# Patient Record
Sex: Male | Born: 1945 | Race: Black or African American | Hispanic: No | Marital: Married | Smoking: Former smoker
Health system: Southern US, Community
[De-identification: ages and names within clinical notes are randomized; demographics above are authoritative.]

## PROBLEM LIST (undated history)

## (undated) DIAGNOSIS — I639 Cerebral infarction, unspecified: Secondary | ICD-10-CM

## (undated) DIAGNOSIS — F32A Depression, unspecified: Secondary | ICD-10-CM

## (undated) DIAGNOSIS — E119 Type 2 diabetes mellitus without complications: Secondary | ICD-10-CM

## (undated) DIAGNOSIS — F329 Major depressive disorder, single episode, unspecified: Secondary | ICD-10-CM

## (undated) DIAGNOSIS — I10 Essential (primary) hypertension: Secondary | ICD-10-CM

## (undated) HISTORY — DX: Cerebral infarction, unspecified: I63.9

## (undated) HISTORY — PX: KNEE SURGERY: SHX244

---

## 1998-05-04 ENCOUNTER — Encounter: Payer: Self-pay | Admitting: Surgery

## 1998-05-04 ENCOUNTER — Inpatient Hospital Stay (HOSPITAL_COMMUNITY): Admission: RE | Admit: 1998-05-04 | Discharge: 1998-05-11 | Payer: Self-pay | Admitting: Surgery

## 1998-05-11 ENCOUNTER — Encounter: Payer: Self-pay | Admitting: Urology

## 2007-05-07 ENCOUNTER — Emergency Department (HOSPITAL_COMMUNITY): Admission: EM | Admit: 2007-05-07 | Discharge: 2007-05-07 | Payer: Self-pay | Admitting: Emergency Medicine

## 2007-05-12 ENCOUNTER — Ambulatory Visit (HOSPITAL_COMMUNITY): Admission: RE | Admit: 2007-05-12 | Discharge: 2007-05-12 | Payer: Self-pay | Admitting: Sports Medicine

## 2007-05-17 ENCOUNTER — Inpatient Hospital Stay (HOSPITAL_COMMUNITY): Admission: AD | Admit: 2007-05-17 | Discharge: 2007-05-18 | Payer: Self-pay | Admitting: Orthopedic Surgery

## 2007-08-09 ENCOUNTER — Encounter: Admission: RE | Admit: 2007-08-09 | Discharge: 2007-08-09 | Payer: Self-pay | Admitting: Orthopedic Surgery

## 2009-07-13 IMAGING — US US EXTREM LOW VENOUS*L*
1 series · 14 of 24 positions shown · non-contrast
Comparison: None

CLINICAL DATA: Left leg swelling



[Series 1: us extrem low venous*left* · 14 of 26 slices shown]
[im 1/26]
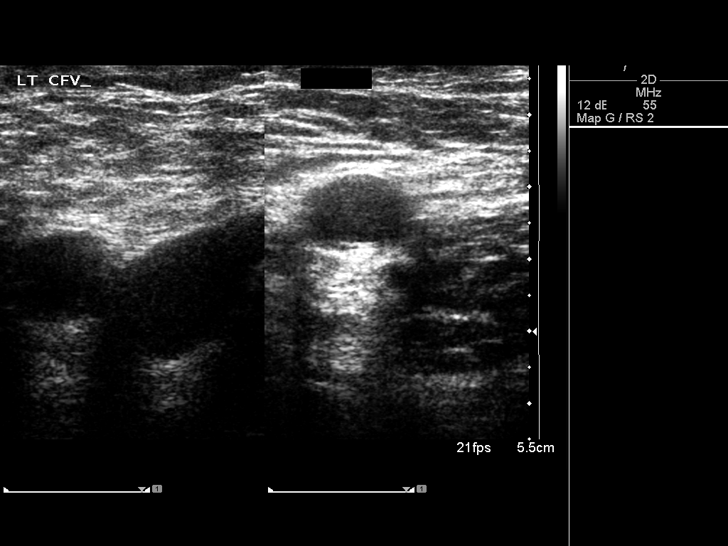
[im 3/26]
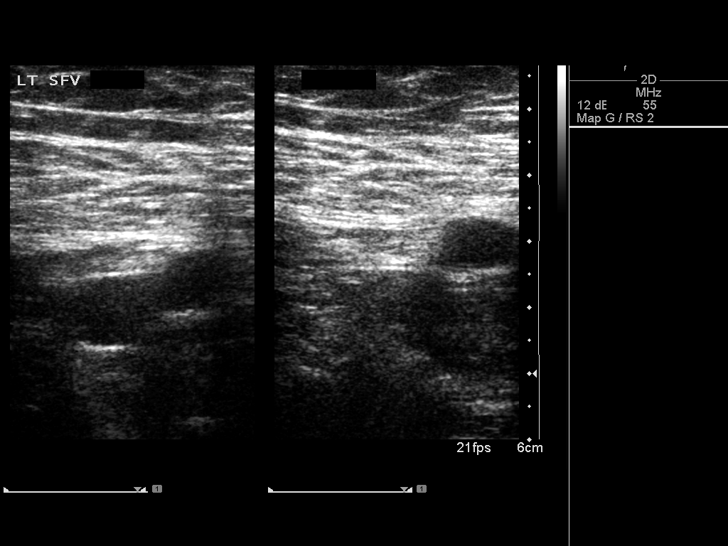
[im 5/26]
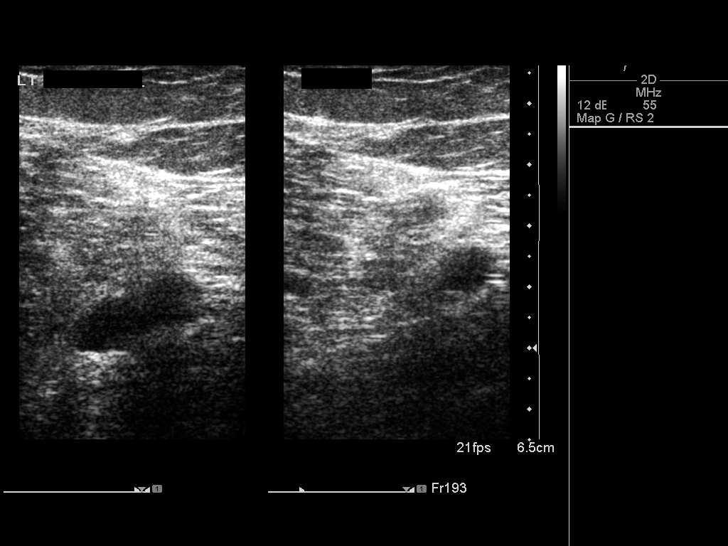
[im 7/26]
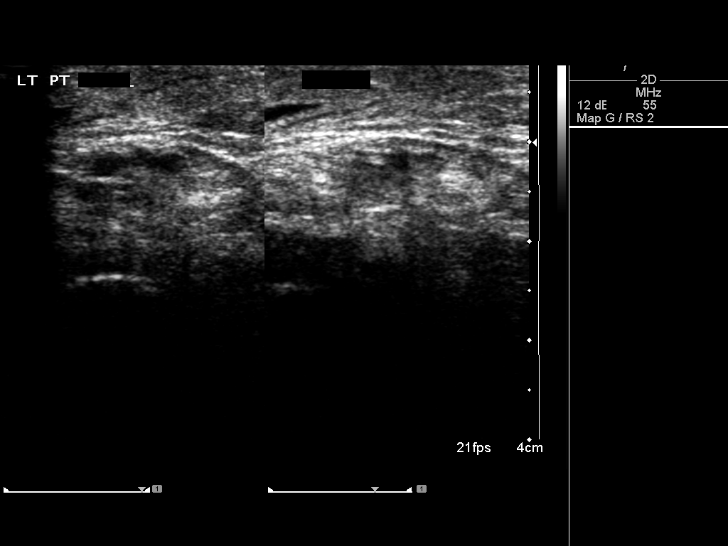
[im 8/26]
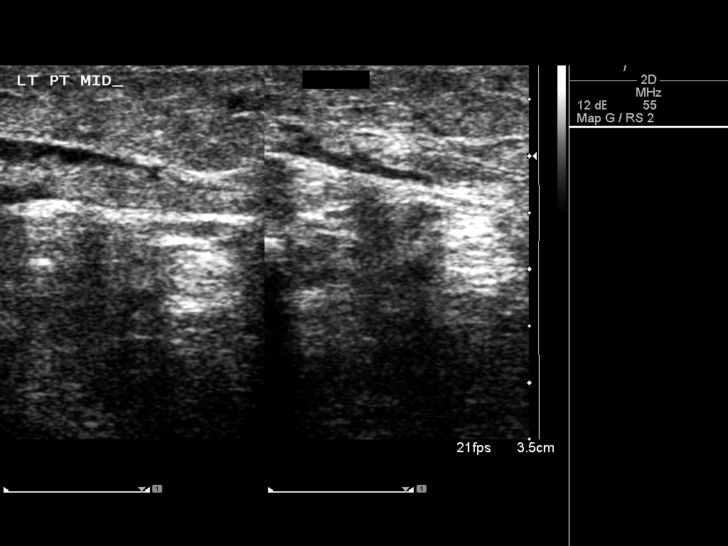
[im 10/26]
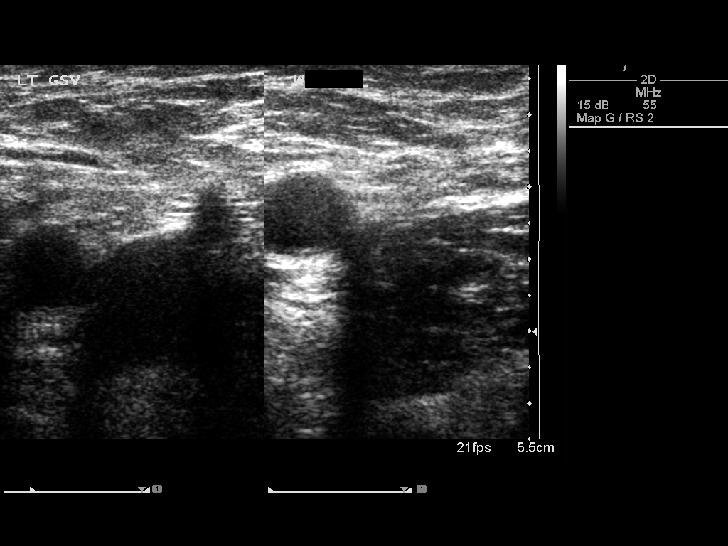
[im 12/26]
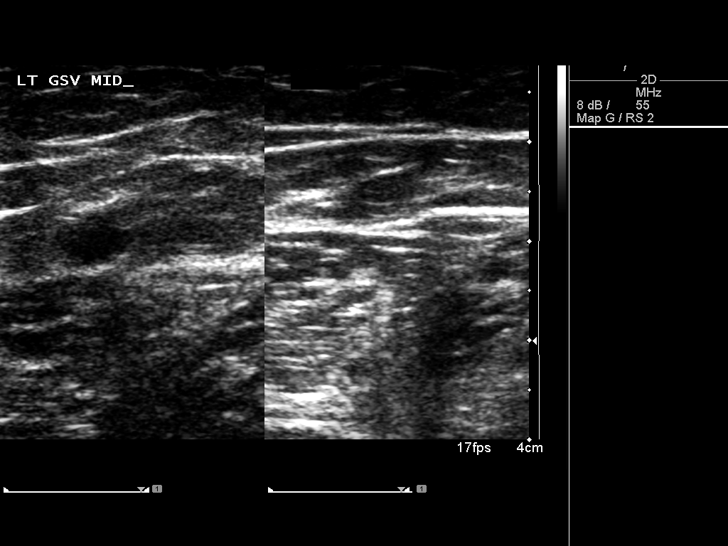
[im 14/26]
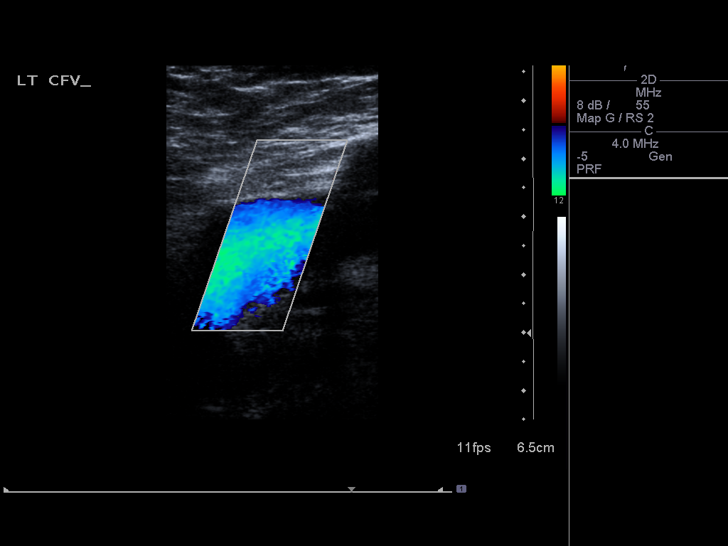
[im 16/26]
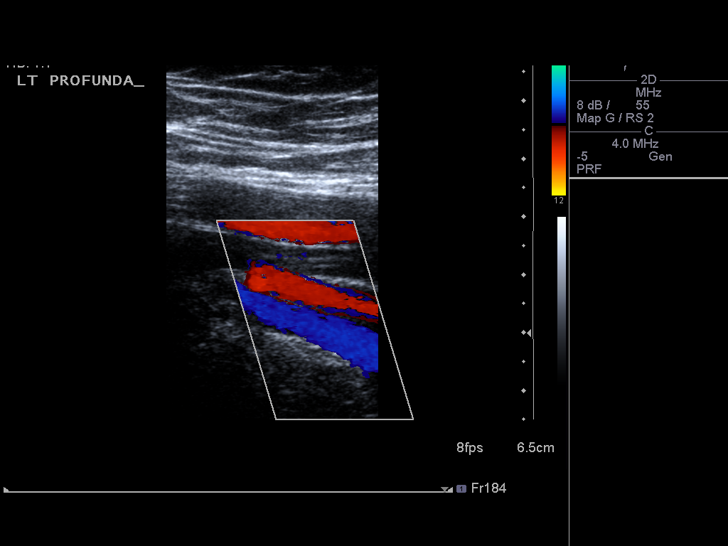
[im 18/26]
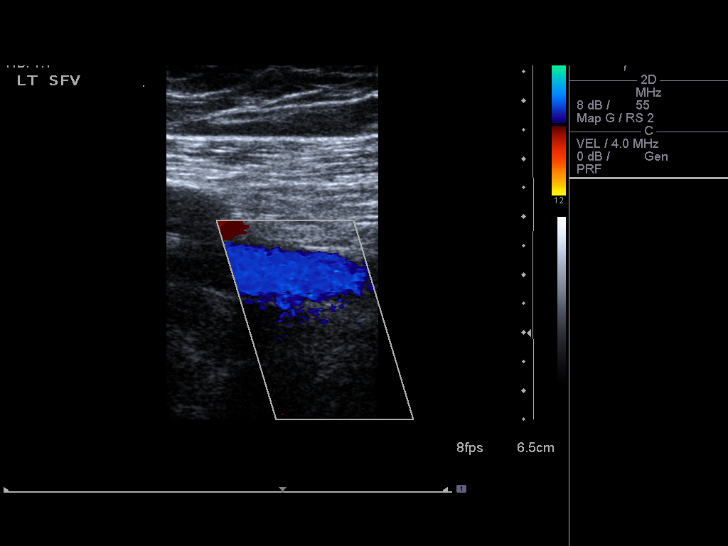
[im 20/26]
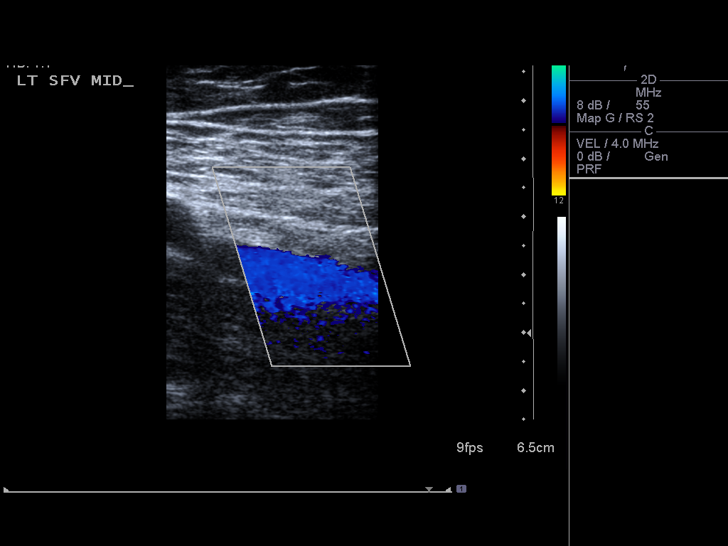
[im 21/26]
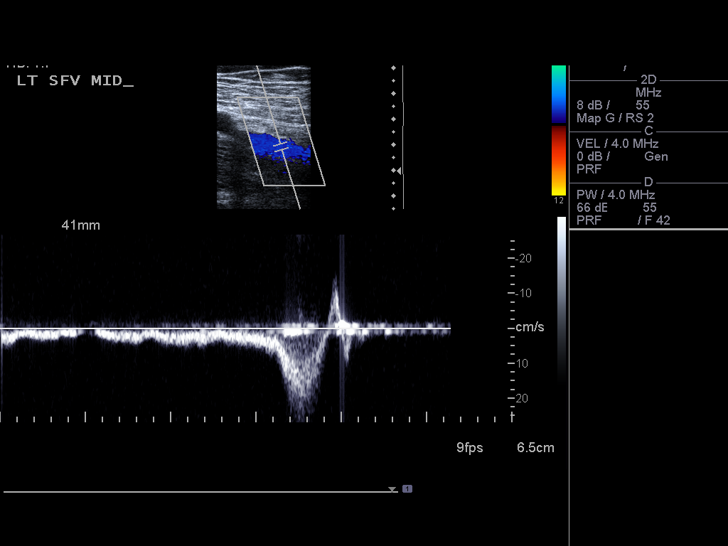
[im 23/26]
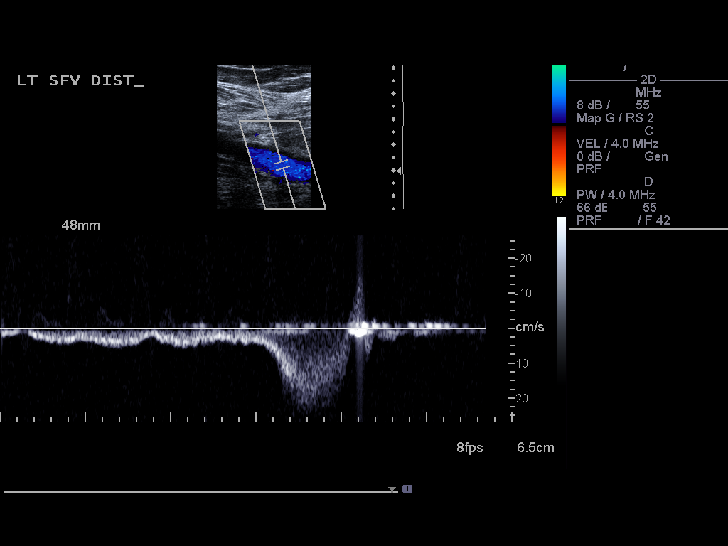
[im 26/26]
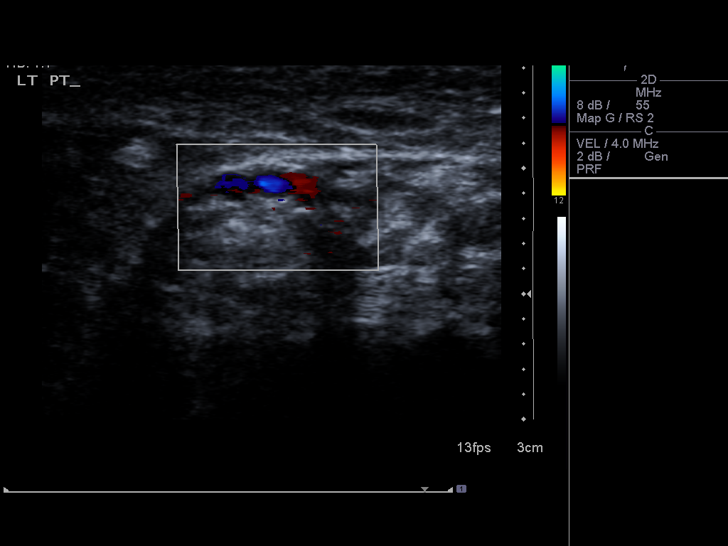

[14 of 24 positions shown; findings below may reference images not displayed]

FINDINGS: The major deep veins from the groin to the popliteal
space show normal compressibility, phasicity, and augmentation.  No
filling defects identified.
IMPRESSION: No evidence for left lower extremity deep venous thrombosis.

## 2010-05-02 ENCOUNTER — Encounter: Payer: Self-pay | Admitting: Sports Medicine

## 2010-08-24 NOTE — Op Note (Signed)
Kevin Flynn, Kevin Flynn               ACCOUNT NO.:  [id number]   MEDICAL RECORD NO.:  [id number]          PATIENT TYPE:  INP   LOCATION:  1510                         FACILITY:  [facility]   PHYSICIAN:  Madlyn Frankel. Charlann Boxer, M.D.  DATE OF BIRTH:  20-Dec-1945   DATE OF PROCEDURE:  05/16/2007  DATE OF DISCHARGE:  05/12/2007                               OPERATIVE REPORT   PREOPERATIVE DIAGNOSIS:  Left quadriceps tendon rupture.   POSTOPERATIVE DIAGNOSIS:  Left quadriceps tendon rupture.   PROCEDURE:  Open repair of left quadriceps tendon.   SURGEON:  Madlyn Frankel. Charlann Boxer, M.D.   ASSISTANT:  Yetta Glassman. Mann, P.A.-C.   ANESTHESIA:  General.   ESTIMATED BLOOD LOSS:  Minimal.   TOURNIQUET TIME:  49 minutes at 300 mmHg.   DRAINS:  None.   COMPLICATIONS:  None.   INDICATIONS FOR PROCEDURE:  Kevin Flynn is a 65 year old gentleman who  fell injuring his left leg and thigh on January 26.  He was initially  seen and evaluated by Dr. Mertha Finders who diagnosed him clinically with  quad rupture based on inability to perform straight leg raising and  palpable defect.  He is sent for evaluation.  When I saw him, an MRI had  already been ordered.  He had reported to me no previous problems with  the knee.  MRI had indicated meniscal pathology, however, the primary  pathology was quad tendon.  I reviewed with him the risks and benefits  of nonoperative versus operative treatment, the risks of infection,  failure of the repair, the therapy that is required after this type of  surgery.  Consent was obtained.   PROCEDURE IN DETAIL:  The patient was brought to the operating theater.  Once adequate anesthesia and preoperative antibiotics, Ancef, were  administered, the patient was positioned supine on the operating table.  A proximal thigh tourniquet was placed.  The left lower extremity was  pre-scrubbed, prepped and draped in a sterile fashion.  The leg was  exsanguinated and tourniquet elevated.  A midline  incision was made.  The midline incision revealed obvious seromatous material that drained  from the knee right from the onset.  I created flaps of soft tissue down  to the layer of the extensor mechanism, both around the knee and the  quadriceps tendon.  It was obvious and evident at the time that the  patient had a very significant injury to his quadriceps mechanism with  tears into both the vastus medialis and the vastus lateralis muscle  bellies, themselves.  Following the initial debridement, it was also  evident that the tendon tore approximately 1 cm from its insertion onto  the patella.  Given the potential healing of tendons and bone, I opened  up the remaining tendon against the patella, saving this for later  repair over the top of the primary repair.   Following this debridement and preparation, I passed two #2 FiberWire  sutures up through the quadriceps tendon in a [place] weave pattern.  With this, I was left with a medial strand of two in the central portion  and  then laterally.  Following placement of these strands, I used a  drill and created three drill holes parallel in the patella from the  proximal and distal aspects of the patella.  The ends of these drills  were found and the sutures were passed through the central portion.  At  this point, the towel clip was utilized to stabilize the patella as I  drew the quadriceps mechanism down to the patella.  Holding with  traction, I sutured the two lateral sutures first followed by the two  medial.  I then sewed these four strands together.  Following this,  there was an excellent reapproximation of the bulk of the quad tendon to  the patellar bony surface.   Time was now spent repairing this muscle.  I used #1 Vicryl to  reapproximate the underlying fascia of the vastus medialis back down to  the central belly of the tendon.  I did this on the lateral side.  Both  muscle tears were quite significant. Following this, I  reapproximated  the tendinous portion right off the base of the patella that I had  opened to allow for primary repair.  I reapproximated this over the top  of the primary repair.  At this time, I irrigated the knee wound.  I  reapproximated the subcu layer with 2-0 Vicryl and used a 4-0 running  Monocryl in the skin.  The skin was cleaned, dressed, and dried  sterilely with Steri-Strips and a bulky dressing.  The knee was placed  in a knee immobilizer with an ice pack.  He was brought to the recovery  room in stable condition extubated.      Madlyn Frankel Charlann Boxer, M.D.  Electronically Signed     MDO/MEDQ  D:  05/16/2007  T:  05/17/2007  Job:  [id number]

## 2010-12-31 LAB — CBC
MCHC: 34.5
MCV: 90.3
Platelets: 254
RBC: 4.94
RDW: 14.8

## 2010-12-31 LAB — BASIC METABOLIC PANEL
BUN: 10
CO2: 31
Calcium: 9.2
Creatinine, Ser: 1.05
GFR calc Af Amer: >60
Glucose, Bld: 110 — ABNORMAL HIGH

## 2010-12-31 LAB — RAPID URINE DRUG SCREEN, HOSP PERFORMED
Benzodiazepines: NOT DETECTED
Cocaine: NOT DETECTED
Opiates: POSITIVE — AB

## 2010-12-31 LAB — DIFFERENTIAL
Basophils Absolute: 0
Basophils Relative: 1
Eosinophils Absolute: 0.1
Neutro Abs: 1.7
Neutrophils Relative %: 33 — ABNORMAL LOW

## 2010-12-31 LAB — PROTIME-INR
INR: 1.1
Prothrombin Time: 13.9

## 2010-12-31 LAB — URINALYSIS, ROUTINE W REFLEX MICROSCOPIC
Glucose, UA: NEGATIVE
Ketones, ur: NEGATIVE
Specific Gravity, Urine: 1.019
pH: 5.5

## 2010-12-31 LAB — APTT: aPTT: 30

## 2013-03-02 ENCOUNTER — Encounter (HOSPITAL_COMMUNITY): Payer: Self-pay | Admitting: Emergency Medicine

## 2013-03-02 ENCOUNTER — Emergency Department (HOSPITAL_COMMUNITY): Payer: Medicare Other

## 2013-03-02 ENCOUNTER — Emergency Department (HOSPITAL_COMMUNITY)
Admission: EM | Admit: 2013-03-02 | Discharge: 2013-03-02 | Disposition: A | Discharge status: Home / Self Care | Payer: Medicare Other | Attending: Emergency Medicine | Admitting: Emergency Medicine

## 2013-03-02 DIAGNOSIS — S43401A Unspecified sprain of right shoulder joint, initial encounter: Secondary | ICD-10-CM

## 2013-03-02 DIAGNOSIS — Z87891 Personal history of nicotine dependence: Secondary | ICD-10-CM | POA: Insufficient documentation

## 2013-03-02 DIAGNOSIS — Y9389 Activity, other specified: Secondary | ICD-10-CM | POA: Insufficient documentation

## 2013-03-02 DIAGNOSIS — IMO0002 Reserved for concepts with insufficient information to code with codable children: Secondary | ICD-10-CM | POA: Insufficient documentation

## 2013-03-02 DIAGNOSIS — Z8659 Personal history of other mental and behavioral disorders: Secondary | ICD-10-CM | POA: Insufficient documentation

## 2013-03-02 DIAGNOSIS — E119 Type 2 diabetes mellitus without complications: Secondary | ICD-10-CM | POA: Insufficient documentation

## 2013-03-02 DIAGNOSIS — S335XXA Sprain of ligaments of lumbar spine, initial encounter: Secondary | ICD-10-CM | POA: Insufficient documentation

## 2013-03-02 DIAGNOSIS — Y92410 Unspecified street and highway as the place of occurrence of the external cause: Secondary | ICD-10-CM | POA: Insufficient documentation

## 2013-03-02 HISTORY — DX: Depression, unspecified: F32.A

## 2013-03-02 HISTORY — DX: Type 2 diabetes mellitus without complications: E11.9

## 2013-03-02 HISTORY — DX: Major depressive disorder, single episode, unspecified: F32.9

## 2013-03-02 MED ORDER — IBUPROFEN 600 MG PO TABS: 600.0000 mg | ORAL_TABLET | Freq: Four times a day (QID) | ORAL | Status: DC | PRN

## 2013-03-02 MED ORDER — HYDROCODONE-ACETAMINOPHEN 5-325 MG PO TABS: 1.0000 | ORAL_TABLET | Freq: Four times a day (QID) | ORAL | Status: DC | PRN

## 2013-03-02 MED ORDER — HYDROCODONE-ACETAMINOPHEN 5-325 MG PO TABS
1.0000 | ORAL_TABLET | Freq: Once | ORAL | Status: AC
  Administered 2013-03-02: 1 via ORAL
  Filled 2013-03-02: qty 1

## 2013-03-02 MED ORDER — CYCLOBENZAPRINE HCL 10 MG PO TABS: 10.0000 mg | ORAL_TABLET | Freq: Two times a day (BID) | ORAL | Status: DC | PRN
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
## 2013-03-02 NOTE — ED Notes (Signed)
Pt escorted to treatment room 7, from the waiting area

## 2013-03-06 NOTE — ED Provider Notes (Signed)
Medical screening examination/treatment/procedure(s) were performed by non-physician practitioner and as supervising physician I was immediately available for consultation/collaboration.  EKG Interpretation   None         Mariavictoria Nottingham J Maebry Obrien, MD 03/06/13 0726 
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
## 2019-02-28 ENCOUNTER — Other Ambulatory Visit: Payer: Self-pay

## 2019-02-28 DIAGNOSIS — Z20822 Contact with and (suspected) exposure to covid-19: Secondary | ICD-10-CM

## 2019-03-04 LAB — NOVEL CORONAVIRUS, NAA: SARS-CoV-2, NAA: NOT DETECTED

## 2022-07-08 ENCOUNTER — Other Ambulatory Visit (HOSPITAL_COMMUNITY): Payer: Self-pay | Admitting: Neurology

## 2022-07-08 DIAGNOSIS — R251 Tremor, unspecified: Secondary | ICD-10-CM

## 2022-07-29 ENCOUNTER — Encounter (HOSPITAL_COMMUNITY)
Admission: RE | Admit: 2022-07-29 | Discharge: 2022-07-29 | Disposition: A | Discharge status: Home / Self Care | Payer: No Typology Code available for payment source | Source: Ambulatory Visit | Attending: Neurology | Admitting: Neurology

## 2022-07-29 DIAGNOSIS — R251 Tremor, unspecified: Secondary | ICD-10-CM | POA: Insufficient documentation

## 2022-07-29 MED ORDER — FAMOTIDINE 20 MG PO TABS
40.0000 mg | ORAL_TABLET | Freq: Once | ORAL | Status: AC
  Administered 2022-07-29: 40 mg via ORAL
  Filled 2022-07-29: qty 2

## 2022-07-29 MED ORDER — POTASSIUM IODIDE (ANTIDOTE) 130 MG PO TABS
ORAL_TABLET | ORAL | Status: AC
  Filled 2022-07-29: qty 1

## 2022-07-29 MED ORDER — DIPHENHYDRAMINE HCL 50 MG/ML IJ SOLN
50.0000 mg | Freq: Once | INTRAMUSCULAR | Status: AC
  Administered 2022-07-29: 50 mg via INTRAVENOUS

## 2022-07-29 MED ORDER — IOFLUPANE I 123 185 MBQ/2.5ML IV SOLN
4.7000 | Freq: Once | INTRAVENOUS | Status: AC | PRN
  Administered 2022-07-29: 4.7 via INTRAVENOUS
  Filled 2022-07-29: qty 5

## 2022-07-29 MED ORDER — DIPHENHYDRAMINE HCL 50 MG/ML IJ SOLN
INTRAMUSCULAR | Status: AC
  Filled 2022-07-29: qty 1

## 2022-07-29 MED ORDER — POTASSIUM IODIDE (ANTIDOTE) 130 MG PO TABS
130.0000 mg | ORAL_TABLET | Freq: Once | ORAL | Status: AC
  Administered 2022-07-29: 130 mg via ORAL

## 2022-07-29 NOTE — Progress Notes (Signed)
PA assessed a patient in Nuc Med after taking oral medicaton. Started to have a rash left upper arm pain denies itching, pain, or shortness of breath. Airway intact. Resting comfortably on stretcher wife at bedside.

## 2022-07-29 NOTE — Progress Notes (Signed)
Patient is scheduled for a NM scan in Radiology today   Patient had a reaction of hives with urticaria to left antecubital after taking 100 mg of potassium iodine po.  Hives appear to be contained with no radiation to chest or back. Patient denies any shob or sore throat. Able to speak in full sentences. Per wife at bedside states that there is no change in the quality of his voice.   Patient was taken to IR holding room for observation. An IV was started and the Patient was given Benadryl 50 mg IV and Pecid 40 mg. Patient placed on monitor. Wife at bedside. Patient was observed for 1 hour after such time the hives appear to be resolving with no additional symptoms reporting. Patient and wife were discharged from IR to continue to NM for study. Patient and wife instructed to call 911 should his symptoms worsen or any issues with breathing or airway. Patient and wife verbalized understanding and are in agreement with the plan of care.   IR MD Dr. DD Deanne Coffer made aware and is in agreement with plan of care.

## 2023-04-20 ENCOUNTER — Inpatient Hospital Stay (HOSPITAL_COMMUNITY): Payer: No Typology Code available for payment source

## 2023-04-20 ENCOUNTER — Encounter (HOSPITAL_COMMUNITY): Payer: Self-pay

## 2023-04-20 ENCOUNTER — Emergency Department (HOSPITAL_COMMUNITY): Payer: No Typology Code available for payment source

## 2023-04-20 ENCOUNTER — Inpatient Hospital Stay (HOSPITAL_COMMUNITY)
Admission: EM | Admit: 2023-04-20 | Discharge: 2023-04-22 | DRG: 065 | Disposition: A | Discharge status: Home / Self Care | Payer: No Typology Code available for payment source | Attending: Internal Medicine | Admitting: Internal Medicine

## 2023-04-20 ENCOUNTER — Other Ambulatory Visit: Payer: Self-pay

## 2023-04-20 DIAGNOSIS — I429 Cardiomyopathy, unspecified: Secondary | ICD-10-CM | POA: Diagnosis present

## 2023-04-20 DIAGNOSIS — I110 Hypertensive heart disease with heart failure: Secondary | ICD-10-CM | POA: Diagnosis present

## 2023-04-20 DIAGNOSIS — Z91199 Patient's noncompliance with other medical treatment and regimen due to unspecified reason: Secondary | ICD-10-CM

## 2023-04-20 DIAGNOSIS — J90 Pleural effusion, not elsewhere classified: Secondary | ICD-10-CM | POA: Diagnosis not present

## 2023-04-20 DIAGNOSIS — J9811 Atelectasis: Secondary | ICD-10-CM | POA: Diagnosis present

## 2023-04-20 DIAGNOSIS — Z888 Allergy status to other drugs, medicaments and biological substances status: Secondary | ICD-10-CM

## 2023-04-20 DIAGNOSIS — R471 Dysarthria and anarthria: Secondary | ICD-10-CM | POA: Diagnosis present

## 2023-04-20 DIAGNOSIS — I2510 Atherosclerotic heart disease of native coronary artery without angina pectoris: Secondary | ICD-10-CM | POA: Diagnosis present

## 2023-04-20 DIAGNOSIS — R270 Ataxia, unspecified: Secondary | ICD-10-CM | POA: Diagnosis present

## 2023-04-20 DIAGNOSIS — I5020 Unspecified systolic (congestive) heart failure: Secondary | ICD-10-CM

## 2023-04-20 DIAGNOSIS — Z6833 Body mass index (BMI) 33.0-33.9, adult: Secondary | ICD-10-CM | POA: Diagnosis not present

## 2023-04-20 DIAGNOSIS — Z87891 Personal history of nicotine dependence: Secondary | ICD-10-CM

## 2023-04-20 DIAGNOSIS — I639 Cerebral infarction, unspecified: Principal | ICD-10-CM | POA: Diagnosis present

## 2023-04-20 DIAGNOSIS — I5022 Chronic systolic (congestive) heart failure: Secondary | ICD-10-CM | POA: Diagnosis present

## 2023-04-20 DIAGNOSIS — G20C Parkinsonism, unspecified: Secondary | ICD-10-CM | POA: Diagnosis present

## 2023-04-20 DIAGNOSIS — E782 Mixed hyperlipidemia: Secondary | ICD-10-CM

## 2023-04-20 DIAGNOSIS — E669 Obesity, unspecified: Secondary | ICD-10-CM | POA: Diagnosis present

## 2023-04-20 DIAGNOSIS — G8191 Hemiplegia, unspecified affecting right dominant side: Secondary | ICD-10-CM | POA: Diagnosis present

## 2023-04-20 DIAGNOSIS — I700 Atherosclerosis of aorta: Secondary | ICD-10-CM | POA: Diagnosis present

## 2023-04-20 DIAGNOSIS — E119 Type 2 diabetes mellitus without complications: Secondary | ICD-10-CM

## 2023-04-20 DIAGNOSIS — I6349 Cerebral infarction due to embolism of other cerebral artery: Secondary | ICD-10-CM | POA: Diagnosis not present

## 2023-04-20 DIAGNOSIS — R29701 NIHSS score 1: Secondary | ICD-10-CM | POA: Diagnosis present

## 2023-04-20 DIAGNOSIS — G4733 Obstructive sleep apnea (adult) (pediatric): Secondary | ICD-10-CM | POA: Diagnosis present

## 2023-04-20 DIAGNOSIS — F32A Depression, unspecified: Secondary | ICD-10-CM | POA: Diagnosis present

## 2023-04-20 DIAGNOSIS — F1090 Alcohol use, unspecified, uncomplicated: Secondary | ICD-10-CM | POA: Diagnosis present

## 2023-04-20 DIAGNOSIS — G4752 REM sleep behavior disorder: Secondary | ICD-10-CM | POA: Diagnosis present

## 2023-04-20 DIAGNOSIS — D72819 Decreased white blood cell count, unspecified: Secondary | ICD-10-CM

## 2023-04-20 DIAGNOSIS — I361 Nonrheumatic tricuspid (valve) insufficiency: Secondary | ICD-10-CM | POA: Diagnosis not present

## 2023-04-20 DIAGNOSIS — G4730 Sleep apnea, unspecified: Secondary | ICD-10-CM

## 2023-04-20 DIAGNOSIS — I509 Heart failure, unspecified: Secondary | ICD-10-CM

## 2023-04-20 DIAGNOSIS — M7989 Other specified soft tissue disorders: Secondary | ICD-10-CM | POA: Diagnosis not present

## 2023-04-20 HISTORY — DX: Essential (primary) hypertension: I10

## 2023-04-20 LAB — CBC
HCT: 44.3 % (ref 39.0–52.0)
HCT: 45.3 % (ref 39.0–52.0)
Hemoglobin: 14.7 g/dL (ref 13.0–17.0)
Hemoglobin: 14.8 g/dL (ref 13.0–17.0)
MCH: 30.8 pg (ref 26.0–34.0)
MCH: 29.8 pg (ref 26.0–34.0)
MCHC: 33.2 g/dL (ref 30.0–36.0)
MCHC: 32.7 g/dL (ref 30.0–36.0)
MCV: 92.9 fL (ref 80.0–100.0)
MCV: 91.3 fL (ref 80.0–100.0)
Platelets: 188 10*3/uL (ref 150–400)
Platelets: 183 10*3/uL (ref 150–400)
RBC: 4.77 MIL/uL (ref 4.22–5.81)
RBC: 4.96 MIL/uL (ref 4.22–5.81)
RDW: 15.7 % — ABNORMAL HIGH (ref 11.5–15.5)
RDW: 15.8 % — ABNORMAL HIGH (ref 11.5–15.5)
WBC: 3.9 10*3/uL — ABNORMAL LOW (ref 4.0–10.5)
WBC: 4.4 10*3/uL (ref 4.0–10.5)
nRBC: 0 % (ref 0.0–0.2)
nRBC: 0 % (ref 0.0–0.2)

## 2023-04-20 LAB — URINALYSIS, ROUTINE W REFLEX MICROSCOPIC
Bilirubin Urine: NEGATIVE
Glucose, UA: NEGATIVE mg/dL
Hgb urine dipstick: NEGATIVE
Ketones, ur: NEGATIVE mg/dL
Leukocytes,Ua: NEGATIVE
Nitrite: NEGATIVE
Protein, ur: NEGATIVE mg/dL
Specific Gravity, Urine: 1.023 (ref 1.005–1.030)
pH: 5 (ref 5.0–8.0)

## 2023-04-20 LAB — RESP PANEL BY RT-PCR (RSV, FLU A&B, COVID)  RVPGX2
Influenza A by PCR: NEGATIVE
Influenza B by PCR: NEGATIVE
Resp Syncytial Virus by PCR: NEGATIVE
SARS Coronavirus 2 by RT PCR: NEGATIVE

## 2023-04-20 LAB — HEMOGLOBIN A1C
Hgb A1c MFr Bld: 5.6 % (ref 4.8–5.6)
Mean Plasma Glucose: 114.02 mg/dL

## 2023-04-20 LAB — BASIC METABOLIC PANEL
Anion gap: 7 (ref 5–15)
BUN: 14 mg/dL (ref 8–23)
CO2: 27 mmol/L (ref 22–32)
Calcium: 9 mg/dL (ref 8.9–10.3)
Chloride: 104 mmol/L (ref 98–111)
Creatinine, Ser: 1.18 mg/dL (ref 0.61–1.24)
GFR, Estimated: >60 mL/min (ref >60)
Glucose, Bld: 94 mg/dL (ref 70–99)
Potassium: 4.1 mmol/L (ref 3.5–5.1)
Sodium: 138 mmol/L (ref 135–145)

## 2023-04-20 LAB — TROPONIN I (HIGH SENSITIVITY)
Troponin I (High Sensitivity): 18 ng/L — ABNORMAL HIGH (ref <18)
Troponin I (High Sensitivity): 19 ng/L — ABNORMAL HIGH (ref <18)

## 2023-04-20 LAB — CREATININE, SERUM
Creatinine, Ser: 1 mg/dL (ref 0.61–1.24)
GFR, Estimated: >60 mL/min (ref >60)

## 2023-04-20 LAB — CBG MONITORING, ED
Glucose-Capillary: 57 mg/dL — ABNORMAL LOW (ref 70–99)
Glucose-Capillary: 100 mg/dL — ABNORMAL HIGH (ref 70–99)
Glucose-Capillary: 75 mg/dL (ref 70–99)

## 2023-04-20 LAB — BRAIN NATRIURETIC PEPTIDE: B Natriuretic Peptide: 973.3 pg/mL — ABNORMAL HIGH (ref 0.0–100.0)

## 2023-04-20 LAB — GLUCOSE, CAPILLARY: Glucose-Capillary: 97 mg/dL (ref 70–99)

## 2023-04-20 MED ORDER — SODIUM CHLORIDE 0.9 % IV SOLN
3.0000 g | Freq: Four times a day (QID) | INTRAVENOUS | Status: DC
  Administered 2023-04-20 – 2023-04-22 (×7): 3 g via INTRAVENOUS
  Filled 2023-04-20 (×7): qty 8

## 2023-04-20 MED ORDER — ENOXAPARIN SODIUM 40 MG/0.4ML IJ SOSY
40.0000 mg | PREFILLED_SYRINGE | INTRAMUSCULAR | Status: DC
  Administered 2023-04-21: 40 mg via SUBCUTANEOUS
  Filled 2023-04-20: qty 0.4

## 2023-04-20 MED ORDER — ATORVASTATIN CALCIUM 40 MG PO TABS
40.0000 mg | ORAL_TABLET | Freq: Every day | ORAL | Status: DC
  Administered 2023-04-20: 40 mg via ORAL
  Filled 2023-04-20: qty 1

## 2023-04-20 MED ORDER — ASPIRIN 325 MG PO TABS
325.0000 mg | ORAL_TABLET | Freq: Every day | ORAL | Status: DC
  Administered 2023-04-20 – 2023-04-21 (×2): 325 mg via ORAL
  Filled 2023-04-20 (×2): qty 1

## 2023-04-20 MED ORDER — LABETALOL HCL 5 MG/ML IV SOLN: 20.0000 mg | INTRAVENOUS | Status: DC | PRN

## 2023-04-20 MED ORDER — ACETAMINOPHEN 650 MG RE SUPP: 650.0000 mg | Freq: Four times a day (QID) | RECTAL | Status: DC | PRN

## 2023-04-20 MED ORDER — INSULIN ASPART 100 UNIT/ML IJ SOLN
0.0000 [IU] | Freq: Three times a day (TID) | INTRAMUSCULAR | Status: DC
  Administered 2023-04-22: 2 [IU] via SUBCUTANEOUS
  Filled 2023-04-20: qty 0.15

## 2023-04-20 MED ORDER — INSULIN ASPART 100 UNIT/ML IJ SOLN
0.0000 [IU] | Freq: Every day | INTRAMUSCULAR | Status: DC
Start: 2023-04-20 — End: 2023-04-22
  Filled 2023-04-20: qty 0.05

## 2023-04-20 MED ORDER — STROKE: EARLY STAGES OF RECOVERY BOOK
Freq: Once | Status: AC
  Filled 2023-04-20: qty 1

## 2023-04-20 MED ORDER — FUROSEMIDE 10 MG/ML IJ SOLN
40.0000 mg | Freq: Once | INTRAMUSCULAR | Status: AC
  Administered 2023-04-20: 40 mg via INTRAVENOUS
  Filled 2023-04-20: qty 4

## 2023-04-20 MED ORDER — LORAZEPAM 2 MG/ML IJ SOLN: 1.0000 mg | Freq: Once | INTRAMUSCULAR | Status: DC | PRN

## 2023-04-20 MED ORDER — SODIUM CHLORIDE 0.9% FLUSH
3.0000 mL | Freq: Two times a day (BID) | INTRAVENOUS | Status: DC
  Administered 2023-04-20 – 2023-04-22 (×4): 3 mL via INTRAVENOUS

## 2023-04-20 MED ORDER — POLYETHYLENE GLYCOL 3350 17 G PO PACK: 17.0000 g | PACK | Freq: Every day | ORAL | Status: DC | PRN

## 2023-04-20 MED ORDER — IOHEXOL 350 MG/ML SOLN
75.0000 mL | Freq: Once | INTRAVENOUS | Status: AC | PRN
  Administered 2023-04-20: 175 mL via INTRAVENOUS

## 2023-04-20 MED ORDER — ENSURE ENLIVE PO LIQD
237.0000 mL | Freq: Two times a day (BID) | ORAL | Status: DC
  Administered 2023-04-21 – 2023-04-22 (×2): 237 mL via ORAL

## 2023-04-20 MED ORDER — ACETAMINOPHEN 325 MG PO TABS: 650.0000 mg | ORAL_TABLET | Freq: Four times a day (QID) | ORAL | Status: DC | PRN

## 2023-04-20 NOTE — ED Notes (Signed)
 Patient transported to CT

## 2023-04-20 NOTE — ED Triage Notes (Addendum)
 Patient has had right sided numbness in the arm and leg for 2 weeks. Increased slurred speech and memory fog. Had a CT scan last Friday and was told they did not see any clots. No smile deviation in triage, able to hold both arms and legs up for 10 seconds without drift in triage.

## 2023-04-20 NOTE — Progress Notes (Signed)
 Pharmacy Antibiotic Note  Kevin Flynn is a 78 y.o. male admitted on 04/20/2023 with stroke. 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 Also with incidental finding of pleural effusion. Concern that patient may have had aspiration event with parapneumonic effusion persisting since. Pharmacy has been consulted for Unasyn  dosing.  Plan: -Unasyn  3 g IV q6h -Follow renal function, cultures and clinical progress for dose adjustments and de-escalation  Height: 5' 8 (172.7 cm) Weight: 98.9 kg (218 lb) IBW/kg (Calculated) : 68.4  Temp (24hrs), Avg:97.3 F (36.3 C), Min:96.9 F (36.1 C), Max:97.8 F (36.6 C)  Recent Labs  Lab 04/20/23 1136  WBC 3.9*  CREATININE 1.18    Estimated Creatinine Clearance: 58.8 mL/min (by C-G formula based on SCr of 1.18 mg/dL).    No Known Allergies  Antimicrobials this admission: Unasyn  1/9 >>  Microbiology results: 1/9 BCx: pending   Thank you for allowing pharmacy to be a part of this patient's care.  Stefano MARLA Bologna, PharmD, BCPS Clinical Pharmacist 04/20/2023 6:51 PM

## 2023-04-20 NOTE — Progress Notes (Signed)
 Patient arrived from [place] [facility], notified admission and Dr. Iver Nestle of neurology.

## 2023-04-20 NOTE — ED Notes (Signed)
 Patient transported to MRI

## 2023-04-20 NOTE — Assessment & Plan Note (Signed)
 This is incidentally noted after patient was noted to have tachypnea.  Given onset around the time the patient had a right arm numbness, concern is that the patient may have had aspiration event with parapneumonic effusion persisting since then.  I will draw blood cultures and start the patient with treatment on Unasyn .  We will get a CT chest and I suspect patient will subsequently need thoracentesis to drain the effusion.  Patient received Lasix  in the ER here 40 mg.  Monitor intake output.  On my exam, patient does not appear systemically fluid overloaded.

## 2023-04-20 NOTE — ED Provider Notes (Signed)
 [place] EMERGENCY DEPARTMENT AT [facility] Provider Note   CSN: [id number] Arrival date & time: 04/20/23  1050     History  Chief Complaint  Patient presents with   Numbness    Kevin Flynn is a 78 y.o. male.  Pt is a 78 yo male with pmhx significant for dm, depression, and htn.  Pt has been having weakness in his right arm and leg for the past week.  He did see his doctor at the [facility] on 1/3 and had a CT scan done of his head which was negative.  Pt's sx are persisting.  Pt also reports decreased appetite and weight loss for the past month.  He denies any pain.         Home Medications Prior to Admission medications   Medication Sig Start Date End Date Taking? Authorizing Provider  cyclobenzaprine  (FLEXERIL ) 10 MG tablet Take 1 tablet (10 mg total) by mouth 2 (two) times daily as needed for muscle spasms. 03/02/13   Kirichenko, Tatyana, PA-C  HYDROcodone -acetaminophen  (NORCO) 5-325 MG per tablet Take 1 tablet by mouth every 6 (six) hours as needed. 03/02/13   Kirichenko, Tatyana, PA-C  ibuprofen  (ADVIL ,MOTRIN ) 600 MG tablet Take 1 tablet (600 mg total) by mouth every 6 (six) hours as needed. 03/02/13   Kirichenko, Tatyana, PA-C      Allergies    Patient has no known allergies.    Review of Systems   Review of Systems  Constitutional:  Positive for appetite change.  Neurological:        Right arm and leg weakness  All other systems reviewed and are negative.   Physical Exam Updated Vital Signs BP (!) 144/106   Pulse 92   Temp 97.8 F (36.6 C) (Oral)   Resp 17   Ht 5' 8 (1.727 m)   Wt 98.9 kg   SpO2 97%   BMI 33.15 kg/m  Physical Exam Vitals and nursing note reviewed.  Constitutional:      Appearance: Normal appearance. He is obese.  HENT:     Head: Normocephalic and atraumatic.     Right Ear: External ear normal.     Left Ear: External ear normal.     Nose: Nose normal.     Mouth/Throat:     Mouth: Mucous membranes are moist.     Pharynx:  Oropharynx is clear.  Eyes:     Extraocular Movements: Extraocular movements intact.     Conjunctiva/sclera: Conjunctivae normal.     Pupils: Pupils are equal, round, and reactive to light.  Cardiovascular:     Rate and Rhythm: Normal rate and regular rhythm.     Pulses: Normal pulses.     Heart sounds: Normal heart sounds.  Pulmonary:     Effort: Pulmonary effort is normal.     Breath sounds: Normal breath sounds.  Abdominal:     General: Abdomen is flat. Bowel sounds are normal.     Palpations: Abdomen is soft.  Musculoskeletal:        General: Normal range of motion.     Cervical back: Normal range of motion and neck supple.  Skin:    General: Skin is warm.     Capillary Refill: Capillary refill takes less than 2 seconds.  Neurological:     Mental Status: He is alert and oriented to person, place, and time.     Comments: Right arm and leg weakness.  Right hand tremor.  Psychiatric:  Mood and Affect: Mood normal.        Behavior: Behavior normal.     ED Results / Procedures / Treatments   Labs (all labs ordered are listed, but only abnormal results are displayed) Labs Reviewed  CBC - Abnormal; Notable for the following components:      Result Value   WBC 3.9 (*)    RDW 15.7 (*)    All other components within normal limits  BRAIN NATRIURETIC PEPTIDE - Abnormal; Notable for the following components:   B Natriuretic Peptide 973.3 (*)    All other components within normal limits  CBG MONITORING, ED - Abnormal; Notable for the following components:   Glucose-Capillary 57 (*)    All other components within normal limits  CBG MONITORING, ED - Abnormal; Notable for the following components:   Glucose-Capillary 100 (*)    All other components within normal limits  TROPONIN I (HIGH SENSITIVITY) - Abnormal; Notable for the following components:   Troponin I (High Sensitivity) 18 (*)    All other components within normal limits  RESP PANEL BY RT-PCR (RSV, FLU A&B,  COVID)  [id number]  BASIC METABOLIC PANEL  URINALYSIS, ROUTINE W REFLEX MICROSCOPIC  CBG MONITORING, ED    EKG EKG Interpretation Date/Time:  Thursday April 20 2023 11:06:32 EST Ventricular Rate:  92 PR Interval:  214 QRS Duration:  116 QT Interval:  396 QTC Calculation: 490 R Axis:   -81  Text Interpretation: Sinus rhythm Ventricular premature complex Borderline prolonged PR interval Probable left atrial enlargement Left anterior fascicular block LVH with secondary repolarization abnormality Anterior Q waves, possibly due to LVH No significant change since last tracing Confirmed by Dean Clarity [phone]) on 04/20/2023 1:21:47 PM  Radiology MR BRAIN WO CONTRAST Result Date: 04/20/2023 CLINICAL DATA:  Right-sided numbness in the arm and leg for 2 weeks, slurred speech and memory fog, right limb, worsening right hand tremor EXAM: MRI HEAD WITHOUT CONTRAST MRI CERVICAL SPINE WITHOUT CONTRAST TECHNIQUE: Multiplanar, multiecho pulse sequences of the brain and surrounding structures, and cervical spine, to include the craniocervical junction and cervicothoracic junction, were obtained without intravenous contrast. COMPARISON:  None Available. FINDINGS: MRI HEAD FINDINGS Brain: Increased signal on diffusion-weighted imaging, with ADC correlates, in the posterior left frontal cortex (series 5, images 37-44 and series 13, images 15-18), which includes the motor strip. The intensity of the diffusion restriction varies, suggesting both acute and subacute infarcts. These areas are associated with increased T2 hyperintense signal. No acute hemorrhage, mass, mass effect, or midline shift. No hydrocephalus or extra-axial collection. Pituitary and craniocervical junction within normal limits. No hemosiderin deposition to suggest remote hemorrhage. Age related cerebral atrophy. Scattered and confluent T2 hyperintense signal in the periventricular white matter, likely the sequela of mild-to-moderate chronic small  vessel ischemic disease. Prominent dilated perivascular spaces in the cortex and basal ganglia. Remote lacunar infarct in pons. Vascular: Loss of the left vertebral artery flow void (series 8, image 4). Otherwise normal arterial flow voids. Skull and upper cervical spine: Normal marrow signal. Sinuses/Orbits: Clear paranasal sinuses. No acute finding in the orbits. Other: The mastoid air cells are well aerated. MRI CERVICAL SPINE FINDINGS Alignment: Straightening of the normal cervical lordosis. No significant listhesis. Levocurvature of the proximal thoracic spine. Vertebrae: No acute fracture, evidence of discitis, or suspicious osseous lesion. Cord: Normal signal and morphology. Posterior Fossa, vertebral arteries, paraspinal tissues: Loss of the left vertebral artery flow void in the V2 and V3 segments. Otherwise negative. Disc levels: C2-C3: No significant disc  bulge. No spinal canal stenosis or neuroforaminal narrowing. C3-C4: No significant disc bulge. Right greater than left facet and uncovertebral hypertrophy. No spinal canal stenosis. Moderate right neural foraminal narrowing. C4-C5: No significant disc bulge. No spinal canal stenosis or neuroforaminal narrowing. C5-C6: No significant disc bulge. No spinal canal stenosis or neuroforaminal narrowing. C6-C7: Minimal disc bulge. Uncovertebral hypertrophy. No spinal canal stenosis. Mild left neural foraminal narrowing. C7-T1: No significant disc bulge. No spinal canal stenosis or neuroforaminal narrowing. IMPRESSION: 1. Acute and subacute infarcts in the posterior left frontal cortex, which includes the motor strip. 2. Loss of the left vertebral artery flow void in the V2, V3, and V4 segments, which is concerning for occlusion or slow flow. This could be further evaluated with a CTA of the head and neck. 3. C3-C4 moderate right neural foraminal narrowing. 4. C6-C7 mild left neural foraminal narrowing. 5. No spinal canal stenosis. These results were called by  telephone at the time of interpretation on 04/20/2023 at 3:22 pm to provider Allianna Beaubien , who verbally acknowledged these results. Electronically Signed   By: Donald Campion M.D.   On: 04/20/2023 15:23   MR Cervical Spine Wo Contrast Result Date: 04/20/2023 CLINICAL DATA:  Right-sided numbness in the arm and leg for 2 weeks, slurred speech and memory fog, right limb, worsening right hand tremor EXAM: MRI HEAD WITHOUT CONTRAST MRI CERVICAL SPINE WITHOUT CONTRAST TECHNIQUE: Multiplanar, multiecho pulse sequences of the brain and surrounding structures, and cervical spine, to include the craniocervical junction and cervicothoracic junction, were obtained without intravenous contrast. COMPARISON:  None Available. FINDINGS: MRI HEAD FINDINGS Brain: Increased signal on diffusion-weighted imaging, with ADC correlates, in the posterior left frontal cortex (series 5, images 37-44 and series 13, images 15-18), which includes the motor strip. The intensity of the diffusion restriction varies, suggesting both acute and subacute infarcts. These areas are associated with increased T2 hyperintense signal. No acute hemorrhage, mass, mass effect, or midline shift. No hydrocephalus or extra-axial collection. Pituitary and craniocervical junction within normal limits. No hemosiderin deposition to suggest remote hemorrhage. Age related cerebral atrophy. Scattered and confluent T2 hyperintense signal in the periventricular white matter, likely the sequela of mild-to-moderate chronic small vessel ischemic disease. Prominent dilated perivascular spaces in the cortex and basal ganglia. Remote lacunar infarct in pons. Vascular: Loss of the left vertebral artery flow void (series 8, image 4). Otherwise normal arterial flow voids. Skull and upper cervical spine: Normal marrow signal. Sinuses/Orbits: Clear paranasal sinuses. No acute finding in the orbits. Other: The mastoid air cells are well aerated. MRI CERVICAL SPINE FINDINGS Alignment:  Straightening of the normal cervical lordosis. No significant listhesis. Levocurvature of the proximal thoracic spine. Vertebrae: No acute fracture, evidence of discitis, or suspicious osseous lesion. Cord: Normal signal and morphology. Posterior Fossa, vertebral arteries, paraspinal tissues: Loss of the left vertebral artery flow void in the V2 and V3 segments. Otherwise negative. Disc levels: C2-C3: No significant disc bulge. No spinal canal stenosis or neuroforaminal narrowing. C3-C4: No significant disc bulge. Right greater than left facet and uncovertebral hypertrophy. No spinal canal stenosis. Moderate right neural foraminal narrowing. C4-C5: No significant disc bulge. No spinal canal stenosis or neuroforaminal narrowing. C5-C6: No significant disc bulge. No spinal canal stenosis or neuroforaminal narrowing. C6-C7: Minimal disc bulge. Uncovertebral hypertrophy. No spinal canal stenosis. Mild left neural foraminal narrowing. C7-T1: No significant disc bulge. No spinal canal stenosis or neuroforaminal narrowing. IMPRESSION: 1. Acute and subacute infarcts in the posterior left frontal cortex, which includes the motor strip.  2. Loss of the left vertebral artery flow void in the V2, V3, and V4 segments, which is concerning for occlusion or slow flow. This could be further evaluated with a CTA of the head and neck. 3. C3-C4 moderate right neural foraminal narrowing. 4. C6-C7 mild left neural foraminal narrowing. 5. No spinal canal stenosis. These results were called by telephone at the time of interpretation on 04/20/2023 at 3:22 pm to provider Leovanni Bjorkman , who verbally acknowledged these results. Electronically Signed   By: Donald Campion M.D.   On: 04/20/2023 15:23   DG Chest Portable 1 View Result Date: 04/20/2023 CLINICAL DATA:  Shortness of breath EXAM: PORTABLE CHEST 1 VIEW COMPARISON:  None Available. FINDINGS: Enlarged cardiopericardial silhouette with vascular congestion. Small effusions. Lung base  opacities identified particularly left retrocardiac. Infiltrates possible. No pneumothorax. Films are under penetrated. Prominent degenerative changes of the spine IMPRESSION: Enlarged cardiopericardial silhouette with vascular congestion. Small effusions with some lung base opacities. Possible infiltrate. Recommend follow up Electronically Signed   By: Ranell Bring M.D.   On: 04/20/2023 14:01    Procedures Procedures    Medications Ordered in ED Medications  LORazepam  (ATIVAN ) injection 1 mg (has no administration in time range)  aspirin  tablet 325 mg (has no administration in time range)  furosemide  (LASIX ) injection 40 mg (has no administration in time range)  iohexol  (OMNIPAQUE ) 350 MG/ML injection 75 mL (175 mLs Intravenous Contrast Given 04/20/23 1604)    ED Course/ Medical Decision Making/ A&P                                 Medical Decision Making Amount and/or Complexity of Data Reviewed Labs: ordered. Radiology: ordered.  Risk OTC drugs. Prescription drug management. Decision regarding hospitalization.   This patient presents to the ED for concern of weakness, this involves an extensive number of treatment options, and is a complaint that carries with it a high risk of complications and morbidity.  The differential diagnosis includes cva, radiculopathy   Co morbidities that complicate the patient evaluation  dm, depression, and htn   Additional history obtained:  Additional history obtained from epic chart review External records from outside source obtained and reviewed including family   Lab Tests:  I Ordered, and personally interpreted labs.  The pertinent results include:  cbc with wbc sl low at 3.9, ua neg, bmp nl; covid/flu/rsv   Imaging Studies ordered:  I ordered imaging studies including mri head, cervical spine, cxr, cta head/neck  I independently visualized and interpreted imaging which showed MRI brain/cervical spine: Acute and subacute  infarcts in the posterior left frontal cortex,  which includes the motor strip.  2. Loss of the left vertebral artery flow void in the V2, V3, and V4  segments, which is concerning for occlusion or slow flow. This could  be further evaluated with a CTA of the head and neck.  3. C3-C4 moderate right neural foraminal narrowing.  4. C6-C7 mild left neural foraminal narrowing.  5. No spinal canal stenosis.    These results were called by telephone at the time of interpretation  on 04/20/2023 at 3:22 pm to provider Helio Lack , who verbally  acknowledged these results.  CXR: Enlarged cardiopericardial silhouette with vascular congestion.    Small effusions with some lung base opacities. Possible infiltrate.  Recommend follow up  CTA head/neck:  I agree with the radiologist interpretation   Cardiac Monitoring:  The  patient was maintained on a cardiac monitor.  I personally viewed and interpreted the cardiac monitored which showed an underlying rhythm of: nsr   Medicines ordered and prescription drug management:  I ordered medication including asa  for sx  Reevaluation of the patient after these medicines showed that the patient improved I have reviewed the patients home medicines and have made adjustments as needed   Test Considered:  mri   Critical Interventions:  mri   Consultations Obtained:  I requested consultation with neurologist (Dr. Voncile),  and discussed lab and imaging findings as well as pertinent plan -he [facility]  He will see pt in consult.   Problem List / ED Course:  CVA:  out of the window for TNK   Reevaluation:  After the interventions noted above, I reevaluated the patient and found that they have :improved   Social Determinants of Health:  Lives at home   Dispostion:  After consideration of the diagnostic results and the patients response to treatment, I feel that the patent would benefit from  admission.          Final Clinical Impression(s) / ED Diagnoses Final diagnoses:  Cerebrovascular accident (CVA), unspecified mechanism (HCC)  Acute congestive heart failure, unspecified heart failure type [facility])    Rx / DC Orders ED Discharge Orders     None         Dean Clarity, MD 04/20/23 1653

## 2023-04-20 NOTE — Assessment & Plan Note (Addendum)
 Symptoms started approximately a week ago.  Now finding of acute and subacute infarcts in the posterior left frontal cortex which includes the motor strip.Case has been discussed with Dr. Deedra of neurology who have advised that the patient would be best served at [facility]  Patient s/p 325 mg of aspirin .  I will start the patient on atorvastatin , check lipid panel.  Patient is a known diabetic.  Patient is already passed swallow screen.  Defer Plavix  therapy to neurology input. I will order echo and telemetry. PT/OT eval. Out of window for liberalised HTN control.  CT angiogram of the head and neck has already been performed.  Please follow-up report in the morning.

## 2023-04-20 NOTE — ED Provider Triage Note (Signed)
 Emergency Medicine Provider Triage Evaluation Note  Kevin Flynn , a 78 y.o. male  was evaluated in triage.  Pt complains of right limp and worsening right hand tremor over past two weeks. Mild tremor of right hand. been present for past three years. Seen at [facility] on 04/14/23 and did CT which was normal and were going to refer him to neurology but has not been referred to his knowledge.  Has had tremor in right hand but seems to have worsened.  Also complains of decreased appetite for about a month. Lost about 20lbs in a month  No recent falls, no hx of stroke, thinners  Review of Systems  Positive: right limp and worsening right hand tremor, weight loss Negative: fever  Physical Exam  BP (!) 152/101   Pulse 96   Temp (!) 96.9 F (36.1 C) (Axillary)   Resp 18   Ht 5' 8 (1.727 m)   Wt 98.9 kg   SpO2 92%   BMI 33.15 kg/m  Gen:   Awake, no distress   Resp:  Normal effort  MSK:   Moves extremities without difficulty  Other:    Medical Decision Making  Medically screening exam initiated at 12:10 PM.  Appropriate orders placed.  Kevin Flynn was informed that the remainder of the evaluation will be completed by another provider, this initial triage assessment does not replace that evaluation, and the importance of remaining in the ED until their evaluation is complete.  Labs ordered   Kevin Flynn, Kevin Flynn 04/20/23 1217

## 2023-04-20 NOTE — H&P (Addendum)
 History and Physical    Patient: Kevin Flynn FMW:[id number] DOB: [birth year] DOA: 04/20/2023 DOS: the patient was seen and examined on 04/20/2023 PCP: [facility]  Patient coming from: Home  Chief Complaint:  Chief Complaint  Patient presents with   Numbness   HPI: Kevin Flynn is a 78 y.o. male with medical history significant of diabetes mellitus, hypertension.  Patient was in his usual state of health till approximately 1 week ago when he reports new onset of right sided arm numbness sensation.  He had a little bit of a similar sensation in the right lower extremity as well.  Patient does not report any trauma neck pain and does not report any weakness.  Patient had an outpatient CAT scan done which was negative approximately a week ago.  However given the persistent symptoms patient presented to [facility] ER this afternoon.  An MRI here, noted below is finding acute CVA.  Case has been discussed with neurology who are requesting transfer to [facility] for further evaluation and workup.  Incidentally patient is noted to be breathing close to 30/min at rest here in the hospital.  Patient does not report any respiratory symptoms such as shortness of breath fever cough or chest pain or leg swelling or palpitations.  However wife reports that she has noted the patient to be breathing a little fast for the period of over the last 1 week. Patient did receive lasix  in the ER.   Review of Systems: As mentioned in the history of present illness. All other systems reviewed and are negative. Past Medical History:  Diagnosis Date   Depression    Diabetes mellitus without complication (HCC)    Hypertension    Past Surgical History:  Procedure Laterality Date   KNEE SURGERY Left tendon repair   Social History:  reports that he has quit smoking. He has never used smokeless tobacco. He reports current alcohol use. He reports that he does not use drugs.  No Known Allergies  History  reviewed. No pertinent family history.  Prior to Admission medications   Medication Sig Start Date End Date Taking? Authorizing Provider  cyclobenzaprine  (FLEXERIL ) 10 MG tablet Take 1 tablet (10 mg total) by mouth 2 (two) times daily as needed for muscle spasms. 03/02/13   Kirichenko, Tatyana, PA-C  HYDROcodone -acetaminophen  (NORCO) 5-325 MG per tablet Take 1 tablet by mouth every 6 (six) hours as needed. 03/02/13   Kirichenko, Tatyana, PA-C  ibuprofen  (ADVIL ,MOTRIN ) 600 MG tablet Take 1 tablet (600 mg total) by mouth every 6 (six) hours as needed. 03/02/13   Adeline Johnson, PA-C    Physical Exam: Vitals:   04/20/23 1733 04/20/23 1746 04/20/23 1814 04/20/23 1819  BP:  (!) 151/108 (!) 162/127   Pulse: 100 (!) 101 99   Resp: 20 (!) 27 16   Temp:    (!) 97.1 F (36.2 C)  TempSrc:    Oral  SpO2: 95% 92% 93%   Weight:      Height:       General: Patient is alert and awake, gives a coherent account of his symptoms, wife at the bedside corroborates patient's story. Respiratory exam: Diffusely slightly diminished breath sounds otherwise good excursion no crackles or wheezes are heard Cardiovascular exam S1-S2 normal Abdomen all quadrants are soft nontender Extremities warm without edema no jugular venous distention No focal motor deficit is appreciable.  Patient has passed swallow screen as per RN Mikael, Dajah Data Reviewed:  Labs on Admission:  Results for  orders placed or performed during the hospital encounter of 04/20/23 (from the past 24 hours)  Urinalysis, Routine w reflex microscopic -Urine, Clean Catch     Status: None   Collection Time: 04/20/23 10:50 AM  Result Value Ref Range   Color, Urine YELLOW YELLOW   APPearance CLEAR CLEAR   Specific Gravity, Urine 1.023 1.005 - 1.030   pH 5.0 5.0 - 8.0   Glucose, UA NEGATIVE NEGATIVE mg/dL   Hgb urine dipstick NEGATIVE NEGATIVE   Bilirubin Urine NEGATIVE NEGATIVE   Ketones, ur NEGATIVE NEGATIVE mg/dL   Protein, ur NEGATIVE  NEGATIVE mg/dL   Nitrite NEGATIVE NEGATIVE   Leukocytes,Ua NEGATIVE NEGATIVE  CBG monitoring, ED     Status: Abnormal   Collection Time: 04/20/23 11:10 AM  Result Value Ref Range   Glucose-Capillary 57 (L) 70 - 99 mg/dL  CBG monitoring, ED     Status: None   Collection Time: 04/20/23 11:35 AM  Result Value Ref Range   Glucose-Capillary 75 70 - 99 mg/dL  Basic metabolic panel     Status: None   Collection Time: 04/20/23 11:36 AM  Result Value Ref Range   Sodium 138 135 - 145 mmol/L   Potassium 4.1 3.5 - 5.1 mmol/L   Chloride 104 98 - 111 mmol/L   CO2 27 22 - 32 mmol/L   Glucose, Bld 94 70 - 99 mg/dL   BUN 14 8 - 23 mg/dL   Creatinine, Ser 8.81 0.61 - 1.24 mg/dL   Calcium  9.0 8.9 - 10.3 mg/dL   GFR, Estimated >39 >39 mL/min   Anion gap 7 5 - 15  CBC     Status: Abnormal   Collection Time: 04/20/23 11:36 AM  Result Value Ref Range   WBC 3.9 (L) 4.0 - 10.5 K/uL   RBC 4.77 4.22 - 5.81 MIL/uL   Hemoglobin 14.7 13.0 - 17.0 g/dL   HCT 55.6 60.9 - 47.9 %   MCV 92.9 80.0 - 100.0 fL   MCH 30.8 26.0 - 34.0 pg   MCHC 33.2 30.0 - 36.0 g/dL   RDW 84.2 (H) 88.4 - 84.4 %   Platelets 188 150 - 400 K/uL   nRBC 0.0 0.0 - 0.2 %  CBG monitoring, ED     Status: Abnormal   Collection Time: 04/20/23 12:00 PM  Result Value Ref Range   Glucose-Capillary 100 (H) 70 - 99 mg/dL  Resp panel by RT-PCR (RSV, Flu A&B, Covid) Anterior Nasal Swab     Status: None   Collection Time: 04/20/23  1:43 PM   Specimen: Anterior Nasal Swab  Result Value Ref Range   SARS Coronavirus 2 by RT PCR NEGATIVE NEGATIVE   Influenza A by PCR NEGATIVE NEGATIVE   Influenza B by PCR NEGATIVE NEGATIVE   Resp Syncytial Virus by PCR NEGATIVE NEGATIVE  Troponin I (High Sensitivity)     Status: Abnormal   Collection Time: 04/20/23  3:57 PM  Result Value Ref Range   Troponin I (High Sensitivity) 18 (H) <18 ng/L  Brain natriuretic peptide     Status: Abnormal   Collection Time: 04/20/23  3:57 PM  Result Value Ref Range   B  Natriuretic Peptide 973.3 (H) 0.0 - 100.0 pg/mL   Basic Metabolic Panel: Recent Labs  Lab 04/20/23 1136  NA 138  K 4.1  CL 104  CO2 27  GLUCOSE 94  BUN 14  CREATININE 1.18  CALCIUM  9.0   Liver Function Tests: No results for input(s): AST, ALT, ALKPHOS, BILITOT,  PROT, ALBUMIN in the last 168 hours. No results for input(s): LIPASE, AMYLASE in the last 168 hours. No results for input(s): AMMONIA in the last 168 hours. CBC: Recent Labs  Lab 04/20/23 1136  WBC 3.9*  HGB 14.7  HCT 44.3  MCV 92.9  PLT 188   Cardiac Enzymes: Recent Labs  Lab 04/20/23 1557  TROPONINIHS 18*    BNP (last 3 results) No results for input(s): PROBNP in the last 8760 hours. CBG: Recent Labs  Lab 04/20/23 1110 04/20/23 1135 04/20/23 1200  GLUCAP 57* 75 100*    Radiological Exams on Admission:  MR BRAIN WO CONTRAST Result Date: 04/20/2023 CLINICAL DATA:  Right-sided numbness in the arm and leg for 2 weeks, slurred speech and memory fog, right limb, worsening right hand tremor EXAM: MRI HEAD WITHOUT CONTRAST MRI CERVICAL SPINE WITHOUT CONTRAST TECHNIQUE: Multiplanar, multiecho pulse sequences of the brain and surrounding structures, and cervical spine, to include the craniocervical junction and cervicothoracic junction, were obtained without intravenous contrast. COMPARISON:  None Available. FINDINGS: MRI HEAD FINDINGS Brain: Increased signal on diffusion-weighted imaging, with ADC correlates, in the posterior left frontal cortex (series 5, images 37-44 and series 13, images 15-18), which includes the motor strip. The intensity of the diffusion restriction varies, suggesting both acute and subacute infarcts. These areas are associated with increased T2 hyperintense signal. No acute hemorrhage, mass, mass effect, or midline shift. No hydrocephalus or extra-axial collection. Pituitary and craniocervical junction within normal limits. No hemosiderin deposition to suggest remote  hemorrhage. Age related cerebral atrophy. Scattered and confluent T2 hyperintense signal in the periventricular white matter, likely the sequela of mild-to-moderate chronic small vessel ischemic disease. Prominent dilated perivascular spaces in the cortex and basal ganglia. Remote lacunar infarct in pons. Vascular: Loss of the left vertebral artery flow void (series 8, image 4). Otherwise normal arterial flow voids. Skull and upper cervical spine: Normal marrow signal. Sinuses/Orbits: Clear paranasal sinuses. No acute finding in the orbits. Other: The mastoid air cells are well aerated. MRI CERVICAL SPINE FINDINGS Alignment: Straightening of the normal cervical lordosis. No significant listhesis. Levocurvature of the proximal thoracic spine. Vertebrae: No acute fracture, evidence of discitis, or suspicious osseous lesion. Cord: Normal signal and morphology. Posterior Fossa, vertebral arteries, paraspinal tissues: Loss of the left vertebral artery flow void in the V2 and V3 segments. Otherwise negative. Disc levels: C2-C3: No significant disc bulge. No spinal canal stenosis or neuroforaminal narrowing. C3-C4: No significant disc bulge. Right greater than left facet and uncovertebral hypertrophy. No spinal canal stenosis. Moderate right neural foraminal narrowing. C4-C5: No significant disc bulge. No spinal canal stenosis or neuroforaminal narrowing. C5-C6: No significant disc bulge. No spinal canal stenosis or neuroforaminal narrowing. C6-C7: Minimal disc bulge. Uncovertebral hypertrophy. No spinal canal stenosis. Mild left neural foraminal narrowing. C7-T1: No significant disc bulge. No spinal canal stenosis or neuroforaminal narrowing. IMPRESSION: 1. Acute and subacute infarcts in the posterior left frontal cortex, which includes the motor strip. 2. Loss of the left vertebral artery flow void in the V2, V3, and V4 segments, which is concerning for occlusion or slow flow. This could be further evaluated with a CTA  of the head and neck. 3. C3-C4 moderate right neural foraminal narrowing. 4. C6-C7 mild left neural foraminal narrowing. 5. No spinal canal stenosis. These results were called by telephone at the time of interpretation on 04/20/2023 at 3:22 pm to provider JULIE HAVILAND , who verbally acknowledged these results. Electronically Signed   By: Donald Jeanann HERO.D.  On: 04/20/2023 15:23   MR Cervical Spine Wo Contrast Result Date: 04/20/2023 CLINICAL DATA:  Right-sided numbness in the arm and leg for 2 weeks, slurred speech and memory fog, right limb, worsening right hand tremor EXAM: MRI HEAD WITHOUT CONTRAST MRI CERVICAL SPINE WITHOUT CONTRAST TECHNIQUE: Multiplanar, multiecho pulse sequences of the brain and surrounding structures, and cervical spine, to include the craniocervical junction and cervicothoracic junction, were obtained without intravenous contrast. COMPARISON:  None Available. FINDINGS: MRI HEAD FINDINGS Brain: Increased signal on diffusion-weighted imaging, with ADC correlates, in the posterior left frontal cortex (series 5, images 37-44 and series 13, images 15-18), which includes the motor strip. The intensity of the diffusion restriction varies, suggesting both acute and subacute infarcts. These areas are associated with increased T2 hyperintense signal. No acute hemorrhage, mass, mass effect, or midline shift. No hydrocephalus or extra-axial collection. Pituitary and craniocervical junction within normal limits. No hemosiderin deposition to suggest remote hemorrhage. Age related cerebral atrophy. Scattered and confluent T2 hyperintense signal in the periventricular white matter, likely the sequela of mild-to-moderate chronic small vessel ischemic disease. Prominent dilated perivascular spaces in the cortex and basal ganglia. Remote lacunar infarct in pons. Vascular: Loss of the left vertebral artery flow void (series 8, image 4). Otherwise normal arterial flow voids. Skull and upper cervical spine:  Normal marrow signal. Sinuses/Orbits: Clear paranasal sinuses. No acute finding in the orbits. Other: The mastoid air cells are well aerated. MRI CERVICAL SPINE FINDINGS Alignment: Straightening of the normal cervical lordosis. No significant listhesis. Levocurvature of the proximal thoracic spine. Vertebrae: No acute fracture, evidence of discitis, or suspicious osseous lesion. Cord: Normal signal and morphology. Posterior Fossa, vertebral arteries, paraspinal tissues: Loss of the left vertebral artery flow void in the V2 and V3 segments. Otherwise negative. Disc levels: C2-C3: No significant disc bulge. No spinal canal stenosis or neuroforaminal narrowing. C3-C4: No significant disc bulge. Right greater than left facet and uncovertebral hypertrophy. No spinal canal stenosis. Moderate right neural foraminal narrowing. C4-C5: No significant disc bulge. No spinal canal stenosis or neuroforaminal narrowing. C5-C6: No significant disc bulge. No spinal canal stenosis or neuroforaminal narrowing. C6-C7: Minimal disc bulge. Uncovertebral hypertrophy. No spinal canal stenosis. Mild left neural foraminal narrowing. C7-T1: No significant disc bulge. No spinal canal stenosis or neuroforaminal narrowing. IMPRESSION: 1. Acute and subacute infarcts in the posterior left frontal cortex, which includes the motor strip. 2. Loss of the left vertebral artery flow void in the V2, V3, and V4 segments, which is concerning for occlusion or slow flow. This could be further evaluated with a CTA of the head and neck. 3. C3-C4 moderate right neural foraminal narrowing. 4. C6-C7 mild left neural foraminal narrowing. 5. No spinal canal stenosis. These results were called by telephone at the time of interpretation on 04/20/2023 at 3:22 pm to provider JULIE HAVILAND , who verbally acknowledged these results. Electronically Signed   By: Donald Campion M.D.   On: 04/20/2023 15:23   DG Chest Portable 1 View Result Date: 04/20/2023 CLINICAL DATA:   Shortness of breath EXAM: PORTABLE CHEST 1 VIEW COMPARISON:  None Available. FINDINGS: Enlarged cardiopericardial silhouette with vascular congestion. Small effusions. Lung base opacities identified particularly left retrocardiac. Infiltrates possible. No pneumothorax. Films are under penetrated. Prominent degenerative changes of the spine IMPRESSION: Enlarged cardiopericardial silhouette with vascular congestion. Small effusions with some lung base opacities. Possible infiltrate. Recommend follow up Electronically Signed   By: Ranell Bring M.D.   On: 04/20/2023 14:01    EKG: Independently reviewed. NSR  Assessment and Plan: * Stroke (cerebrum) (HCC) Symptoms started approximately a week ago.  Now finding of acute and subacute infarcts in the posterior left frontal cortex which includes the motor strip.Case has been discussed with Dr. Deedra of neurology who have advised that the patient would be best served at [facility].  Patient s/p 325 mg of aspirin .  I will start the patient on atorvastatin , check lipid panel.  Patient is a known diabetic.  Patient is already passed swallow screen.  Defer Plavix  therapy to neurology input. I will order echo and telemetry. PT/OT eval. Out of window for liberalised HTN control.  CT angiogram of the head and neck has already been performed.  Please follow-up report in the morning.   Pleural effusion This is incidentally noted after patient was noted to have tachypnea.  Given onset around the time the patient had a right arm numbness, concern is that the patient may have had aspiration event with parapneumonic effusion persisting since then.  I will draw blood cultures and start the patient with treatment on Unasyn .  We will get a CT chest and I suspect patient will subsequently need thoracentesis to drain the effusion.  Patient received Lasix  in the ER here 40 mg.  Monitor intake output.  On my exam, patient does not appear systemically fluid  overloaded.  Leukopenia This is new and incidental.  Will check a folate and B12 in the morning as well as differential.  White count is 3.9, baseline is from 15 years ago was 5.3.  DVT ppx ordered with Lovenox . Lower ext US  order to t/r/o VTE as cause of effusion.  Home medication reconciliation pending pharmacy input  Advance Care Planning:   Code Status: Full Code   Consults: neurology. Dr. Voncile engaged.  Family Communication: wife at bedside. All questions answerd.  Severity of Illness: The appropriate patient status for this patient is INPATIENT. Inpatient status is judged to be reasonable and necessary in order to provide the required intensity of service to ensure the patient's safety. The patient's presenting symptoms, physical exam findings, and initial radiographic and laboratory data in the context of their chronic comorbidities is felt to place them at high risk for further clinical deterioration. Furthermore, it is not anticipated that the patient will be medically stable for discharge from the hospital within 2 midnights of admission.   * I certify that at the point of admission it is my clinical judgment that the patient will require inpatient hospital care spanning beyond 2 midnights from the point of admission due to high intensity of service, high risk for further deterioration and high frequency of surveillance required.*  Author: Jacqulyn Divine, MD 04/20/2023 6:29 PM  For on call review [email or web address].

## 2023-04-20 NOTE — Assessment & Plan Note (Signed)
 This is new and incidental.  Will check a folate and B12 in the morning as well as differential.  White count is 3.9, baseline is from 15 years ago was 5.3.

## 2023-04-20 NOTE — ED Notes (Signed)
 [facility] called.

## 2023-04-20 NOTE — Plan of Care (Signed)
  Problem: Fluid Volume: Goal: Ability to maintain a balanced intake and output will improve Outcome: Not Progressing   Problem: Health Behavior/Discharge Planning: Goal: Ability to manage health-related needs will improve Outcome: Not Progressing   Problem: Metabolic: Goal: Ability to maintain appropriate glucose levels will improve Outcome: Not Progressing   Problem: Tissue Perfusion: Goal: Adequacy of tissue perfusion will improve Outcome: Not Progressing   Problem: Ischemic Stroke/TIA Tissue Perfusion: Goal: Complications of ischemic stroke/TIA will be minimized Outcome: Not Progressing   Problem: Self-Care: Goal: Ability to participate in self-care as condition permits will improve Outcome: Not Progressing Goal: Verbalization of feelings and concerns over difficulty with self-care will improve Outcome: Not Progressing Goal: Ability to communicate needs accurately will improve Outcome: Not Progressing

## 2023-04-21 ENCOUNTER — Inpatient Hospital Stay (HOSPITAL_COMMUNITY): Payer: No Typology Code available for payment source

## 2023-04-21 DIAGNOSIS — I5020 Unspecified systolic (congestive) heart failure: Secondary | ICD-10-CM

## 2023-04-21 DIAGNOSIS — I509 Heart failure, unspecified: Secondary | ICD-10-CM

## 2023-04-21 DIAGNOSIS — I429 Cardiomyopathy, unspecified: Secondary | ICD-10-CM

## 2023-04-21 DIAGNOSIS — Z794 Long term (current) use of insulin: Secondary | ICD-10-CM

## 2023-04-21 DIAGNOSIS — J90 Pleural effusion, not elsewhere classified: Secondary | ICD-10-CM

## 2023-04-21 DIAGNOSIS — Z87891 Personal history of nicotine dependence: Secondary | ICD-10-CM

## 2023-04-21 DIAGNOSIS — I63412 Cerebral infarction due to embolism of left middle cerebral artery: Secondary | ICD-10-CM

## 2023-04-21 DIAGNOSIS — M7989 Other specified soft tissue disorders: Secondary | ICD-10-CM

## 2023-04-21 DIAGNOSIS — I361 Nonrheumatic tricuspid (valve) insufficiency: Secondary | ICD-10-CM

## 2023-04-21 DIAGNOSIS — E119 Type 2 diabetes mellitus without complications: Secondary | ICD-10-CM

## 2023-04-21 DIAGNOSIS — G4730 Sleep apnea, unspecified: Secondary | ICD-10-CM

## 2023-04-21 DIAGNOSIS — E1159 Type 2 diabetes mellitus with other circulatory complications: Secondary | ICD-10-CM

## 2023-04-21 DIAGNOSIS — I639 Cerebral infarction, unspecified: Secondary | ICD-10-CM

## 2023-04-21 DIAGNOSIS — E782 Mixed hyperlipidemia: Secondary | ICD-10-CM

## 2023-04-21 LAB — RAPID URINE DRUG SCREEN, HOSP PERFORMED
Amphetamines: NOT DETECTED
Barbiturates: NOT DETECTED
Benzodiazepines: NOT DETECTED
Cocaine: NOT DETECTED
Opiates: NOT DETECTED
Tetrahydrocannabinol: NOT DETECTED

## 2023-04-21 LAB — ECHOCARDIOGRAM COMPLETE
Area-P 1/2: 5.02 cm2
Calc EF: 31.5 %
Height: 68 in
MV M vel: 4.61 m/s
MV Peak grad: 85 mm[Hg]
P 1/2 time: 490 ms
S' Lateral: 4.9 cm
Single Plane A2C EF: 36.7 %
Single Plane A4C EF: 30.3 %
Weight: 3425.07 [oz_av]

## 2023-04-21 LAB — BASIC METABOLIC PANEL
Anion gap: 9 (ref 5–15)
BUN: 11 mg/dL (ref 8–23)
CO2: 27 mmol/L (ref 22–32)
Calcium: 9 mg/dL (ref 8.9–10.3)
Chloride: 104 mmol/L (ref 98–111)
Creatinine, Ser: 1.26 mg/dL — ABNORMAL HIGH (ref 0.61–1.24)
GFR, Estimated: 58 mL/min — ABNORMAL LOW (ref >60)
Glucose, Bld: 114 mg/dL — ABNORMAL HIGH (ref 70–99)
Potassium: 4.1 mmol/L (ref 3.5–5.1)
Sodium: 140 mmol/L (ref 135–145)

## 2023-04-21 LAB — CBC WITH DIFFERENTIAL/PLATELET
Abs Immature Granulocytes: 0 10*3/uL (ref 0.00–0.07)
Basophils Absolute: 0 10*3/uL (ref 0.0–0.1)
Basophils Relative: 1 %
Eosinophils Absolute: 0.1 10*3/uL (ref 0.0–0.5)
Eosinophils Relative: 2 %
HCT: 45 % (ref 39.0–52.0)
Hemoglobin: 14.9 g/dL (ref 13.0–17.0)
Immature Granulocytes: 0 %
Lymphocytes Relative: 49 %
Lymphs Abs: 2.4 10*3/uL (ref 0.7–4.0)
MCH: 29.7 pg (ref 26.0–34.0)
MCHC: 33.1 g/dL (ref 30.0–36.0)
MCV: 89.8 fL (ref 80.0–100.0)
Monocytes Absolute: 0.4 10*3/uL (ref 0.1–1.0)
Monocytes Relative: 8 %
Neutro Abs: 1.9 10*3/uL (ref 1.7–7.7)
Neutrophils Relative %: 40 %
Platelets: 200 10*3/uL (ref 150–400)
RBC: 5.01 MIL/uL (ref 4.22–5.81)
RDW: 15.8 % — ABNORMAL HIGH (ref 11.5–15.5)
WBC: 4.8 10*3/uL (ref 4.0–10.5)
nRBC: 0 % (ref 0.0–0.2)

## 2023-04-21 LAB — LIPID PANEL
Cholesterol: 167 mg/dL (ref 0–200)
HDL: 37 mg/dL — ABNORMAL LOW (ref >40)
LDL Cholesterol: 119 mg/dL — ABNORMAL HIGH (ref 0–99)
Total CHOL/HDL Ratio: 4.5 {ratio}
Triglycerides: 54 mg/dL (ref <150)
VLDL: 11 mg/dL (ref 0–40)

## 2023-04-21 LAB — PROTIME-INR
INR: 1.2 (ref 0.8–1.2)
Prothrombin Time: 15.5 s — ABNORMAL HIGH (ref 11.4–15.2)

## 2023-04-21 LAB — VITAMIN B12: Vitamin B-12: 679 pg/mL (ref 180–914)

## 2023-04-21 LAB — GLUCOSE, CAPILLARY
Glucose-Capillary: 66 mg/dL — ABNORMAL LOW (ref 70–99)
Glucose-Capillary: 104 mg/dL — ABNORMAL HIGH (ref 70–99)
Glucose-Capillary: 115 mg/dL — ABNORMAL HIGH (ref 70–99)
Glucose-Capillary: 97 mg/dL (ref 70–99)

## 2023-04-21 LAB — APTT: aPTT: 28 s (ref 24–36)

## 2023-04-21 LAB — TSH: TSH: 1.415 u[IU]/mL (ref 0.350–4.500)

## 2023-04-21 LAB — FOLATE: Folate: 9.5 ng/mL (ref >5.9)

## 2023-04-21 MED ORDER — ASPIRIN 81 MG PO TBEC: 81.0000 mg | DELAYED_RELEASE_TABLET | Freq: Every day | ORAL | Status: DC

## 2023-04-21 MED ORDER — CLOPIDOGREL BISULFATE 75 MG PO TABS
300.0000 mg | ORAL_TABLET | Freq: Once | ORAL | Status: AC
  Administered 2023-04-21: 300 mg via ORAL
  Filled 2023-04-21: qty 4

## 2023-04-21 MED ORDER — ATORVASTATIN CALCIUM 40 MG PO TABS
40.0000 mg | ORAL_TABLET | Freq: Every day | ORAL | Status: DC
  Administered 2023-04-21: 40 mg via ORAL
  Filled 2023-04-21: qty 1

## 2023-04-21 MED ORDER — METOPROLOL SUCCINATE ER 25 MG PO TB24
25.0000 mg | ORAL_TABLET | Freq: Every day | ORAL | Status: DC
  Administered 2023-04-21 – 2023-04-22 (×2): 25 mg via ORAL
  Filled 2023-04-21 (×2): qty 1

## 2023-04-21 MED ORDER — DAPAGLIFLOZIN PROPANEDIOL 10 MG PO TABS
10.0000 mg | ORAL_TABLET | Freq: Every day | ORAL | Status: DC
  Administered 2023-04-21 – 2023-04-22 (×2): 10 mg via ORAL
  Filled 2023-04-21 (×2): qty 1

## 2023-04-21 MED ORDER — FUROSEMIDE 10 MG/ML IJ SOLN: 40.0000 mg | Freq: Every day | INTRAMUSCULAR | Status: DC

## 2023-04-21 MED ORDER — LOSARTAN POTASSIUM 25 MG PO TABS
25.0000 mg | ORAL_TABLET | Freq: Every day | ORAL | Status: DC
  Administered 2023-04-21 – 2023-04-22 (×2): 25 mg via ORAL
  Filled 2023-04-21 (×3): qty 1

## 2023-04-21 MED ORDER — PERFLUTREN LIPID MICROSPHERE
1.0000 mL | INTRAVENOUS | Status: AC | PRN
  Administered 2023-04-21: 1 mL via INTRAVENOUS

## 2023-04-21 MED ORDER — APIXABAN 5 MG PO TABS
5.0000 mg | ORAL_TABLET | Freq: Two times a day (BID) | ORAL | Status: DC
  Administered 2023-04-21 – 2023-04-22 (×2): 5 mg via ORAL
  Filled 2023-04-21 (×2): qty 1

## 2023-04-21 MED ORDER — FUROSEMIDE 10 MG/ML IJ SOLN
20.0000 mg | Freq: Once | INTRAMUSCULAR | Status: AC
  Administered 2023-04-21: 20 mg via INTRAVENOUS
  Filled 2023-04-21: qty 4

## 2023-04-21 MED ORDER — CLOPIDOGREL BISULFATE 75 MG PO TABS: 75.0000 mg | ORAL_TABLET | Freq: Every day | ORAL | Status: DC

## 2023-04-21 NOTE — Hospital Course (Signed)
 78 y.o. male with medical history significant of diabetes mellitus, hypertension.  Patient was in his usual state of health till approximately 1 week ago when he reports new onset of right sided arm numbness sensation.  He had a little bit of a similar sensation in the right lower extremity as well.  Patient does not report any trauma neck pain and does not report any weakness.  Patient had an outpatient CAT scan done which was negative approximately a week ago.  However given the persistent symptoms patient presented to [facility] long ER this afternoon.  An MRI here, noted below is finding acute CVA.  Case has been discussed with neurology who are requesting transfer to [facility] for further evaluation and workup.

## 2023-04-21 NOTE — Progress Notes (Signed)
   04/21/23 1627  Spiritual Encounters  Type of Visit Initial  Care provided to: Patient  Reason for visit Advance directives   Chaplain responded to Spiritual Consult for Advance Care Directive.  Chaplain arrived in room and delivered ACD education. Instructed Pt how to reach out to Spiritual Care if they have any questions and to contact us  when they are ready to move forward.  Chaplain services remain available by Spiritual Consult or for emergent cases, paging [phone]

## 2023-04-21 NOTE — Progress Notes (Signed)
 Blood sugar is 66 mg/dl, patient drank 657 ml of orange juice with sugar and ate graham cracker, per patient will be calling in for his breakfast.

## 2023-04-21 NOTE — Progress Notes (Addendum)
 STROKE TEAM PROGRESS NOTE   BRIEF HPI Mr. Kevin Flynn is a 78 y.o. male with history of hypertension, hyperlipidemia, type 2 diabetes, obstructive sleep apnea not currently adherent to CPAP, intermittent marijuana use, very remote 4-pack-year history of smoking, recent daily alcohol use.  Symptoms started about 2 weeks ago where he noted weakness in his right hand.  He does additionally note increased fatigue.  He stopped using his CPAP in September 2024 and has additionally had a 25 pound weight loss.  MRI shows acute/subacute infarcts in the posterior left frontal cortex.  Echocardiogram today with EF of 25-30%.  Of note patient follows with a PCP at the [facility] and he does see an neurology provider through the [facility].  He has never seen a cardiologist.  NIH on Admission 0  INTERIM HISTORY/SUBJECTIVE Cardiology consulted. Neurologically stable. Plan to transition to Eliquis  and he will need cardiology follow up outpatient for cardiac catheterization, GDMT titration, and repeat echo.   OBJECTIVE  CBC    Component Value Date/Time   WBC 4.8 04/21/2023 0124   RBC 5.01 04/21/2023 0124   HGB 14.9 04/21/2023 0124   HCT 45.0 04/21/2023 0124   PLT 200 04/21/2023 0124   MCV 89.8 04/21/2023 0124   MCH 29.7 04/21/2023 0124   MCHC 33.1 04/21/2023 0124   RDW 15.8 (H) 04/21/2023 0124   LYMPHSABS 2.4 04/21/2023 0124   MONOABS 0.4 04/21/2023 0124   EOSABS 0.1 04/21/2023 0124   BASOSABS 0.0 04/21/2023 0124    BMET    Component Value Date/Time   NA 140 04/21/2023 0124   K 4.1 04/21/2023 0124   CL 104 04/21/2023 0124   CO2 27 04/21/2023 0124   GLUCOSE 114 (H) 04/21/2023 0124   BUN 11 04/21/2023 0124   CREATININE 1.26 (H) 04/21/2023 0124   CALCIUM  9.0 04/21/2023 0124   GFRNONAA 58 (L) 04/21/2023 0124    IMAGING past 24 hours CT CHEST WO CONTRAST Result Date: 04/20/2023 CLINICAL DATA:  Neural effusion EXAM: CT CHEST WITHOUT CONTRAST TECHNIQUE: Multidetector CT imaging of the chest was performed  following the standard protocol without IV contrast. RADIATION DOSE REDUCTION: This exam was performed according to the departmental dose-optimization program which includes automated exposure control, adjustment of the mA and/or kV according to patient size and/or use of iterative reconstruction technique. COMPARISON:  Chest x-ray 04/20/2023 FINDINGS: Cardiovascular: Limited assessment without intravenous contrast. Mild aortic atherosclerosis. Maximum ascending aortic diameter of 4.1 cm. Coronary vascular calcifications. Cardiomegaly. No sizable pericardial effusion Mediastinum/Nodes: Patent trachea. No thyroid mass. Multiple borderline mediastinal lymph nodes. Right paratracheal nodes measuring up to 11 mm. Esophagus within normal limits Lungs/Pleura: Small right greater than left pleural effusions. Passive atelectasis in the lower lobes. Upper Abdomen: No acute finding. Right adrenal nodule measuring 3 cm with density value of -0.8. Diffusely thickened left adrenal gland. 2.1 cm left adrenal nodule with density value of 1.4. Excreted contrast within the kidneys 2.1 cm left adrenal nodule with density value of 1.4. Musculoskeletal: No acute osseous abnormality IMPRESSION: 1. Small right greater than left pleural effusions with passive atelectasis in the lower lobes. 2. Cardiomegaly. Coronary vascular calcifications. 3. Borderline mediastinal lymph nodes, nonspecific. 4. Bilateral adrenal adenomas for which no imaging follow-up is recommended. 5. Aortic atherosclerosis. Aortic Atherosclerosis (ICD10-I70.0). Electronically Signed   By: Luke Bun M.D.   On: 04/20/2023 19:15   CT ANGIO HEAD NECK W WO CM Result Date: 04/20/2023 CLINICAL DATA:  Left MCA territory infarct on same-day MRI, loss of the left vertebral artery  flow void EXAM: CT ANGIOGRAPHY HEAD AND NECK WITH AND WITHOUT CONTRAST TECHNIQUE: Multidetector CT imaging of the head and neck was performed using the standard protocol during bolus  administration of intravenous contrast. Multiplanar CT image reconstructions and MIPs were obtained to evaluate the vascular anatomy. Carotid stenosis measurements (when applicable) are obtained utilizing NASCET criteria, using the distal internal carotid diameter as the denominator. RADIATION DOSE REDUCTION: This exam was performed according to the departmental dose-optimization program which includes automated exposure control, adjustment of the mA and/or kV according to patient size and/or use of iterative reconstruction technique. CONTRAST:  OMNIPAQUE  IOHEXOL  350 MG/ML SOLN COMPARISON:  No prior CT head or CTA head and neck available, correlation is made with MRI head 04/20/2023 FINDINGS: Evaluation is limited by bolus timing, despite multiple attempts to time the contrast. Given the slow is with which the contrast passed from the pulmonary arteries to the aorta, a cardiac etiology is suspected CT HEAD FINDINGS Brain: The acute and subacute infarcts seen on MRI are not appreciated on CT. No evidence of additional acute infarct, hemorrhage, mass, mass effect, or midline shift. No hydrocephalus or extra-axial fluid collection. Vascular: No hyperdense vessel. Skull: Negative for fracture or focal lesion. Sinuses/Orbits: No acute finding. Other: The mastoid air cells are well aerated. CTA NECK FINDINGS Aortic arch: The aortic arch is incompletely included in the field of view. Two-vessel arch with a common origin of the brachiocephalic and left common carotid arteries. Imaged portion shows no evidence of aneurysm or dissection. Right carotid system: No evidence of dissection, occlusion, or hemodynamically significant stenosis (greater than 50%). Left carotid system: No evidence of dissection, occlusion, or hemodynamically significant stenosis (greater than 50%). Vertebral arteries: The vertebral arteries are not sufficiently opacified for adequate evaluation. Skeleton: No acute osseous abnormality.  Degenerative changes in the cervical spine. Other neck: No acute finding. Upper chest: No focal pulmonary opacity or pleural effusion. Review of the MIP images confirms the above findings CTA HEAD FINDINGS Evaluation is limited by bolus timing, as described above, which primarily limits evaluation of the posterior circulation. Anterior circulation: Both internal carotid arteries are patent to the termini, with mild stenosis in the right cavernous and supraclinoid ICA (series 18, images 196, 2 2, and 209) Aplastic right A1. Patent left A1. 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 Anterior cerebral arteries are patent through the A2 segments, with evaluation of more distal segments limited by bolus timing. Moderate stenosis in the distal left M1 (series 18, image 211). No significant stenosis right M1. The M2 segments and proximal M3 segments are perfused without significant stenosis. Posterior circulation: Poor visualization of the bilateral vertebral arteries, which may be due to bolus timing as well as existing stenosis. The distal basilar artery is patent but diminutive. Patent left P1. Fetal origin of the right PCA and near fetal origin of the left PCA from the posterior communicating arteries. Severe stenosis in the mid right P2 (series 18, image 206). A severe stenosis in the proximal left P2 (series 18, image 211). Evaluation past the P2 segments is limited by bolus timing. Venous sinuses: As permitted by contrast timing, patent. Anatomic variants: Aplastic right A1. Fetal right PCA. Near fetal left PCA. No evidence of vascular malformation. Review of the MIP images confirms the above findings IMPRESSION: 1. Evaluation is limited by bolus timing, despite multiple attempts to time the contrast. Given the slow is with which the contrast passed from the pulmonary  arteries to the aorta, a cardiac etiology is suspected. 2. The intracranial and  extracranial vertebral arteries are not sufficiently opacified for adequate evaluation. The PCAs are primarily supplied by the posterior communicating arteries. Opacification of the distal basilar artery may be retrograde. 3. Moderate stenosis in the distal left M1. Mild stenosis right cavernous and supraclinoid ICA. 4. Severe stenosis in the mid right P2 and proximal left P2. 5. 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 Electronically Signed   By: Donald Campion M.D.   On: 04/20/2023 16:58   MR BRAIN WO CONTRAST Result Date: 04/20/2023 CLINICAL DATA:  Right-sided numbness in the arm and leg for 2 weeks, slurred speech and memory fog, right limb, worsening right hand tremor EXAM: MRI HEAD WITHOUT CONTRAST MRI CERVICAL SPINE WITHOUT CONTRAST TECHNIQUE: Multiplanar, multiecho pulse sequences of the brain and surrounding structures, and cervical spine, to include the craniocervical junction and cervicothoracic junction, were obtained without intravenous contrast. COMPARISON:  None Available. FINDINGS: MRI HEAD FINDINGS Brain: Increased signal on diffusion-weighted imaging, with ADC correlates, in the posterior left frontal cortex (series 5, images 37-44 and series 13, images 15-18), which includes the motor strip. The intensity of the diffusion restriction varies, suggesting both acute and subacute infarcts. These areas are associated with increased T2 hyperintense signal. No acute hemorrhage, mass, mass effect, or midline shift. No hydrocephalus or extra-axial collection. Pituitary and craniocervical junction within normal limits. No hemosiderin deposition to suggest remote hemorrhage. Age related cerebral atrophy. Scattered and confluent T2 hyperintense signal in the periventricular white matter, likely the sequela of mild-to-moderate chronic small vessel ischemic disease. Prominent dilated perivascular spaces in the cortex and basal ganglia. Remote lacunar infarct in pons.  Vascular: Loss of the left vertebral artery flow void (series 8, image 4). Otherwise normal arterial flow voids. Skull and upper cervical spine: Normal marrow signal. Sinuses/Orbits: Clear paranasal sinuses. No acute finding in the orbits. Other: The mastoid air cells are well aerated. MRI CERVICAL SPINE FINDINGS Alignment: Straightening of the normal cervical lordosis. No significant listhesis. Levocurvature of the proximal thoracic spine. Vertebrae: No acute fracture, evidence of discitis, or suspicious osseous lesion. Cord: Normal signal and morphology. Posterior Fossa, vertebral arteries, paraspinal tissues: Loss of the left vertebral artery flow void in the V2 and V3 segments. Otherwise negative. Disc levels: C2-C3: No significant disc bulge. No spinal canal stenosis or neuroforaminal narrowing. C3-C4: No significant disc bulge. Right greater than left facet and uncovertebral hypertrophy. No spinal canal stenosis. Moderate right neural foraminal narrowing. C4-C5: No significant disc bulge. No spinal canal stenosis or neuroforaminal narrowing. C5-C6: No significant disc bulge. No spinal canal stenosis or neuroforaminal narrowing. C6-C7: Minimal disc bulge. Uncovertebral hypertrophy. No spinal canal stenosis. Mild left neural foraminal narrowing. C7-T1: No significant disc bulge. No spinal canal stenosis or neuroforaminal narrowing. IMPRESSION: 1. Acute and subacute infarcts in the posterior left frontal cortex, which includes the motor strip. 2. Loss of the left vertebral artery flow void in the V2, V3, and V4 segments, which is concerning for occlusion or slow flow. This could be further evaluated with a CTA of the head and neck. 3. C3-C4 moderate right neural foraminal narrowing. 4. C6-C7 mild left neural foraminal narrowing. 5. No spinal canal stenosis. These results were called by telephone at the time of interpretation on 04/20/2023 at 3:22 pm to provider JULIE HAVILAND , who verbally acknowledged these  results. Electronically Signed   By: Donald Campion M.D.   On: 04/20/2023 15:23  MR Cervical Spine Wo Contrast Result Date: 04/20/2023 CLINICAL DATA:  Right-sided numbness in the arm and leg for 2 weeks, slurred speech and memory fog, right limb, worsening right hand tremor EXAM: MRI HEAD WITHOUT CONTRAST MRI CERVICAL SPINE WITHOUT CONTRAST TECHNIQUE: Multiplanar, multiecho pulse sequences of the brain and surrounding structures, and cervical spine, to include the craniocervical junction and cervicothoracic junction, were obtained without intravenous contrast. COMPARISON:  None Available. FINDINGS: MRI HEAD FINDINGS Brain: Increased signal on diffusion-weighted imaging, with ADC correlates, in the posterior left frontal cortex (series 5, images 37-44 and series 13, images 15-18), which includes the motor strip. The intensity of the diffusion restriction varies, suggesting both acute and subacute infarcts. These areas are associated with increased T2 hyperintense signal. No acute hemorrhage, mass, mass effect, or midline shift. No hydrocephalus or extra-axial collection. Pituitary and craniocervical junction within normal limits. No hemosiderin deposition to suggest remote hemorrhage. Age related cerebral atrophy. Scattered and confluent T2 hyperintense signal in the periventricular white matter, likely the sequela of mild-to-moderate chronic small vessel ischemic disease. Prominent dilated perivascular spaces in the cortex and basal ganglia. Remote lacunar infarct in pons. Vascular: Loss of the left vertebral artery flow void (series 8, image 4). Otherwise normal arterial flow voids. Skull and upper cervical spine: Normal marrow signal. Sinuses/Orbits: Clear paranasal sinuses. No acute finding in the orbits. Other: The mastoid air cells are well aerated. MRI CERVICAL SPINE FINDINGS Alignment: Straightening of the normal cervical lordosis. No significant listhesis. Levocurvature of the proximal thoracic spine.  Vertebrae: No acute fracture, evidence of discitis, or suspicious osseous lesion. Cord: Normal signal and morphology. Posterior Fossa, vertebral arteries, paraspinal tissues: Loss of the left vertebral artery flow void in the V2 and V3 segments. Otherwise negative. Disc levels: C2-C3: No significant disc bulge. No spinal canal stenosis or neuroforaminal narrowing. C3-C4: No significant disc bulge. Right greater than left facet and uncovertebral hypertrophy. No spinal canal stenosis. Moderate right neural foraminal narrowing. C4-C5: No significant disc bulge. No spinal canal stenosis or neuroforaminal narrowing. C5-C6: No significant disc bulge. No spinal canal stenosis or neuroforaminal narrowing. C6-C7: Minimal disc bulge. Uncovertebral hypertrophy. No spinal canal stenosis. Mild left neural foraminal narrowing. C7-T1: No significant disc bulge. No spinal canal stenosis or neuroforaminal narrowing. IMPRESSION: 1. Acute and subacute infarcts in the posterior left frontal cortex, which includes the motor strip. 2. Loss of the left vertebral artery flow void in the V2, V3, and V4 segments, which is concerning for occlusion or slow flow. This could be further evaluated with a CTA of the head and neck. 3. C3-C4 moderate right neural foraminal narrowing. 4. C6-C7 mild left neural foraminal narrowing. 5. No spinal canal stenosis. These results were called by telephone at the time of interpretation on 04/20/2023 at 3:22 pm to provider JULIE HAVILAND , who verbally acknowledged these results. Electronically Signed   By: Donald Campion M.D.   On: 04/20/2023 15:23   DG Chest Portable 1 View Result Date: 04/20/2023 CLINICAL DATA:  Shortness of breath EXAM: PORTABLE CHEST 1 VIEW COMPARISON:  None Available. FINDINGS: Enlarged cardiopericardial silhouette with vascular congestion. Small effusions. Lung base opacities identified particularly left retrocardiac. Infiltrates possible. No pneumothorax. Films are under penetrated.  Prominent degenerative changes of the spine IMPRESSION: Enlarged cardiopericardial silhouette with vascular congestion. Small effusions with some lung base opacities. Possible infiltrate. Recommend follow up Electronically Signed   By: Ranell Bring M.D.   On: 04/20/2023 14:01    Vitals:   04/20/23 2206 04/21/23 0025 04/21/23 0455  04/21/23 0600  BP:  131/89 (!) 140/96   Pulse:  88 87   Resp:  19 14   Temp:  98.7 F (37.1 C) 97.6 F (36.4 C)   TempSrc:  Oral Oral   SpO2:  99% 97%   Weight:    97.1 kg  Height: 5' 8 (1.727 m)        PHYSICAL EXAM General:  Alert, well-nourished, well-developed patient in no acute distress Psych:  Mood and affect appropriate for situation CV: Regular rate and rhythm on monitor Respiratory:  Regular, unlabored respirations on room air GI: Abdomen soft and nontender   NEURO:  Mental Status: AA&Ox3, patient is able to give clear and coherent history Speech/Language: speech is without dysarthria or aphasia.  Naming, repetition, fluency, and comprehension intact.  Cranial Nerves:  II: PERRL. Visual fields full.  III, IV, VI: EOMI. Eyelids elevate symmetrically.  V: Sensation is intact to light touch and symmetrical to face.  VII: Face is symmetrical resting and smiling VIII: hearing intact to voice. IX, X: Palate elevates symmetrically. Phonation is normal.  KP:Dynloizm shrug 5/5. XII: tongue is midline without fasciculations. Motor: 5/5 strength to all muscle groups tested.  Tone: is normal and bulk is normal Sensation- Intact to light touch bilaterally. Extinction absent to light touch to DSS.   Coordination: FTN intact bilaterally, HKS: no ataxia in BLE.No drift.  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  Opacification of the distal basilar artery may be retrograde. Moderate stenosis in the distal left M1. Mild stenosis right cavernous and supraclinoid ICA. Severe stenosis in the mid right P2 and proximal left P2.  MRI  Acute and subacute infarcts in the posterior left frontal cortex, which includes the motor strip Lower extremity venous duplex - negative for venous duplex 2D Echo EF is 25-30%, left atria mildly dilated LDL 119 HgbA1c 5.6 UDS pending VTE prophylaxis - Eliquis   No antithrombotic prior to admission, now on Eliquis  . Repeat TTE in 3 months, once EF > 30-35%, may switch AC to antiplatlet.  Therapy recommendations:  Outpatient PT/OT/ST Disposition:  Home  New onset HFrEF Pleural effusion Echocardiogram shows LVEF newly reduced EF 25 to 30% with regional wall motion abnormalities consistent with possible LAD infarct.  No LV thrombus was noted.  Grade 2 diastolic dysfunction, mildly reduced RV function with severely elevated PASP with a RVSP of 62.  Small pleural effusion also noted.  Overall mildly volume up but had RA pressure of 15 with dilated IVC, still short of breath Will need right and left heart catheterization outpatient  IV Lasix  given GDMT: start farxiga  10, losartan  25 mg, Toprol -XL 25 mg.   Avoid hypotension with acute/subacute stroke. Repeat echocardiogram in 3 months. On eliquis  now  Hyperlipidemia LDL 119, goal < 70 Add Atorvastatin  40mg  Continue statin at discharge  Diabetes type II Controlled Home meds:  Semglee HgbA1c 5.6, goal < 7.0 CBGs SSI Recommend close follow-up with PCP for better DM control  Other Stroke Risk Factors ETOH use, advised to drink no more than 2 drink(s) a day Obesity, Body mass index is 32.55 kg/m., BMI >/= 30 associated with increased stroke risk, recommend weight loss, diet and exercise as appropriate  Obstructive sleep apnea, on CPAP at home Poor compliance starting in  September 2024 Agreeable to better compliance with  CPAP   Hospital day # 1  Patient seen and examined by NP/APP with MD. MD to update note as needed.   Jorene Last, DNP, FNP-BC [facility] Pager: [phone]  ATTENDING NOTE: I reviewed above note and agree with the assessment and plan. Pt was seen and examined.   Wife at the bedside. Pt lying in bed, stated that his right sided weakness and slurry speech have much improved. On exam, he has right hand action tremor which is chronic that could mask mild right FTN ataxia, but he does have decreased dexterity of right hand, otherwise, neuro intact.   Pt stroke likely due to cardiomyopathy with low EF which is a new diagnosis for him. Discussed with Dr. Michele, cardiology will put on Eliquis  for [facility], once EF more than 30-35% may switch to antiplatelet. Agree with GDMT medication, avoid low BP. Add statin. He follows with his neurologist in [place].   For detailed assessment and plan, please refer to above/below as I have made changes wherever appropriate.   Neurology will sign off. Please call with questions. Pt will follow up with his neurologist in [place] in about 4 weeks. Thanks for the consult.   Ary Cummins, MD PhD Stroke Neurology 04/21/2023 6:11 PM  I discussed with Dr. Michele and Dr Cindy. I spent additional 30 min inpatient face-to-face time with the patient, more than 50% of which was spent in counseling and coordination of care, reviewing test results, images and medication, and discussing the diagnosis, treatment plan and potential prognosis. This patient's care requiresreview of multiple databases, neurological assessment, discussion with family, other specialists and medical decision making of high complexity.      To contact Stroke Continuity provider, please refer to [email or web address]. After hours, contact General Neurology

## 2023-04-21 NOTE — Progress Notes (Signed)
  Progress Note   Patient: Kevin Flynn FMW:[id number] DOB: [birth year] DOA: 04/20/2023     1 DOS: the patient was seen and examined on 04/21/2023   Brief hospital course: 78 y.o. male with medical history significant of diabetes mellitus, hypertension.  Patient was in his usual state of health till approximately 1 week ago when he reports new onset of right sided arm numbness sensation.  He had a little bit of a similar sensation in the right lower extremity as well.  Patient does not report any trauma neck pain and does not report any weakness.  Patient had an outpatient CAT scan done which was negative approximately a week ago.  However given the persistent symptoms patient presented to [facility] long ER this afternoon.  An MRI here, noted below is finding acute CVA.  Case has been discussed with neurology who are requesting transfer to [facility] for further evaluation and workup.    Assessment and Plan: Stroke (cerebrum) (HCC) Symptoms started approximately a week ago.  Now finding of acute and subacute infarcts in the posterior left frontal cortex which includes the motor strip -Neurology following. Stroke felt to be cardioembolic. Neuro recs for initiating eliquis  -seen by pt/ot  Diabetes -glycemic trends stable -Continued on SSI as needed  Systolic CHF, new diagnosis -New EF of 25% -Appreciate Cardiology input. Ultimately would benefit from heart cath, likely would be done outpt -GDMT to be initiated here. Monitor here for bp and tolerance  Pleural effusion This is incidentally noted after patient was noted to have tachypnea.   -suspect secondary to above CHF   Leukopenia resolved Repeat WBC is normal Resolved  OSA -cont with CPAP as tolerated -Pt admits to noncompliance at home   Subjective: Without complaints this AM  Physical Exam: Vitals:   04/21/23 0455 04/21/23 0600 04/21/23 1216 04/21/23 1504  BP: (!) 140/96  121/86 (!) 138/100  Pulse: 87  88 95  Resp: 14   20   Temp: 97.6 F (36.4 C)   97.7 F (36.5 C)  TempSrc: Oral  Oral Oral  SpO2: 97%  97% 91%  Weight:  97.1 kg    Height:       General exam: Awake, laying in bed, in nad Respiratory system: Normal respiratory effort, no wheezing Cardiovascular system: regular rate, s1, s2 Gastrointestinal system: Soft, nondistended, positive BS Central nervous system: CN2-12 grossly intact, strength intact Extremities: Perfused, no clubbing Skin: Normal skin turgor, no notable skin lesions seen Psychiatry: Mood normal // no visual hallucinations   Data Reviewed:  Labs reviewed: Na 140, K 4.1, Cr 1.26, WBC 4.8, hgb 14.9  Family Communication: Pt in room, family at bedside  Disposition: Status is: Inpatient Remains inpatient appropriate because: severity of illness  Planned Discharge Destination: Home    Author: Garnette Pelt, MD 04/21/2023 4:43 PM  For on call review [email or web address].

## 2023-04-21 NOTE — Consult Note (Addendum)
 Cardiology Consultation   Patient ID: Kevin Flynn MRN: [id number]; DOB: August 06, 1945  Admit date: 04/20/2023 Date of Consult: 04/21/2023  PCP:  [facility]   [facility] Providers Cardiologist:  None   { Dr. Michele (new)   Patient Profile:   Kevin Flynn is a 78 y.o. male with a hx of hypertension, diabetes, OSA not on CPAP, baseline tremor, who is being seen 04/21/2023 for the evaluation of new onset HFrEF at the request of Dr. Cindy.  History of Present Illness:   Kevin Flynn  is followed by the [place].  Denies any family history of cardiac disease.  Previous smoker x 4-pack-year history.  Recent alcohol use however stopped recently. Intermittent marijuana use.  He was having some motor function weakness [place] well as possible slurred speech reported by his wife as well as some gait issues.  On admission he was noted to be tachypneic and incidentally found to have pleural effusion that was felt to be parapneumonic due to possible aspiration event.  He has been placed on antibiotics prophylactically.  CT confirms small right greater than left pleural effusion with passive atelectasis.  He was also given 1 dose of IV Lasix  40 mg with reportedly good urine output and improvement in tachypnea/shortness of breath.  Echocardiogram was obtained and shows newly reduced EF 25 to 30% with regional wall motion abnormalities concerning for LAD infarct.  Mildly reduced RV function with elevated PASP, 62.1.  No LV thrombus noted.  Cardiology consulted for newly discovered HFrEF and cardiomyopathy.  Today he is accompanied with his wife.  Continues to have some motor deficits however doing well overall.  His wife reports that for the past few months he has had progressive decrease in exercise intolerance and previously noted to have a brisk gait and able to ambulate without difficulty however more recently has had difficulty with this and has been shuffling and been more short of  breath and tachypneic.  However, patient has denied any chest pain, orthopnea, PND, palpitations, falls, syncope, peripheral edema.  They have also reported that he has had unintentional weight loss of 25+ pounds due to decreased appetite and also noted to have leukopenia on admission.  CT scan also noted aortic atherosclerosis and coronary calcifications.  TSH normal.  Troponins 18-19.  BNP 973.  Past Medical History:  Diagnosis Date   Depression    Diabetes mellitus without complication (HCC)    Hypertension     Past Surgical History:  Procedure Laterality Date   KNEE SURGERY Left tendon repair    Inpatient Medications: Scheduled Meds:  [START ON 04/22/2023] aspirin  EC  81 mg Oral Daily   atorvastatin   40 mg Oral QHS   [START ON 04/22/2023] clopidogrel   75 mg Oral Daily   enoxaparin  (LOVENOX ) injection  40 mg Subcutaneous Q24H   feeding supplement  237 mL Oral BID BM   insulin  aspart  0-15 Units Subcutaneous TID WC   insulin  aspart  0-5 Units Subcutaneous QHS   sodium chloride  flush  3 mL Intravenous Q12H   Continuous Infusions:  ampicillin -sulbactam (UNASYN ) IV 3 g (04/21/23 0817)   PRN Meds: acetaminophen  **OR** acetaminophen , labetalol , LORazepam , polyethylene glycol  Allergies:    Allergies  Allergen Reactions   Primidone Other (See Comments)    Drowsy     Social History:   Social History   Socioeconomic History   Marital status: Married    Spouse name: Not on file   Number of children:  Not on file   Years of education: Not on file   Highest education level: Not on file  Occupational History   Not on file  Tobacco Use   Smoking status: Former   Smokeless tobacco: Never  Substance and Sexual Activity   Alcohol use: Yes    Comment: occasionally   Drug use: No   Sexual activity: Not on file  Other Topics Concern   Not on file  Social History Narrative   Not on file   Social Drivers of Health   Financial Resource Strain: Not on file  Food Insecurity:  No Food Insecurity (04/20/2023)   Hunger Vital Sign    Worried About Running Out of Food in the Last Year: Never true    Ran Out of Food in the Last Year: Never true  Transportation Needs: No Transportation Needs (04/20/2023)   PRAPARE - Administrator, Civil Service (Medical): No    Lack of Transportation (Non-Medical): No  Physical Activity: Not on file  Stress: Not on file  Social Connections: Socially Integrated (04/20/2023)   Social Connection and Isolation Panel [NHANES]    Frequency of Communication with Friends and Family: More than three times a week    Frequency of Social Gatherings with Friends and Family: More than three times a week    Attends Religious Services: More than 4 times per year    Active Member of [employer] or Organizations: Yes    Attends Banker Meetings: More than 4 times per year    Marital Status: Married  Catering Manager Violence: Not At Risk (04/20/2023)   Humiliation, Afraid, Rape, and Kick questionnaire    Fear of Current or Ex-Partner: No    Emotionally Abused: No    Physically Abused: No    Sexually Abused: No    Family History:   History reviewed. No pertinent family history.   ROS:  Please see the history of present illness.  All other ROS reviewed and negative.     Physical Exam/Data:   Vitals:   04/21/23 0025 04/21/23 0455 04/21/23 0600 04/21/23 1216  BP: 131/89 (!) 140/96  121/86  Pulse: 88 87  88  Resp: 19 14  20   Temp: 98.7 F (37.1 C) 97.6 F (36.4 C)    TempSrc: Oral Oral  Oral  SpO2: 99% 97%  97%  Weight:   97.1 kg   Height:        Intake/Output Summary (Last 24 hours) at 04/21/2023 1401 Last data filed at 04/20/2023 2301 Gross per 24 hour  Intake 460 ml  Output --  Net 460 ml      04/21/2023    6:00 AM 04/20/2023    6:42 PM 04/20/2023   11:02 AM  Last 3 Weights  Weight (lbs) 214 lb 1.1 oz 218 lb 218 lb  Weight (kg) 97.1 kg 98.884 kg 98.884 kg     Body mass index is 32.55 kg/m.  General:  Well  nourished, well developed, in no acute distress HEENT: normal Neck: no JVD Vascular: No carotid bruits; Distal pulses 2+ bilaterally Cardiac:  normal S1, S2; RRR; no murmur  Lungs:  clear to auscultation bilaterally, no wheezing, rhonchi or rales  Abd: soft, nontender, no hepatomegaly  Ext: mild edema Musculoskeletal:  No deformities, BUE and BLE strength normal and equal Skin: warm and dry  Neuro:  CNs 2-12 intact, no focal abnormalities noted Psych:  Normal affect   EKG:  The EKG was personally reviewed and demonstrates:  Normal sinus rhythm, heart rate 92.  First-degree AV block, PR 214.  PVC.  IVCD.  Left anterior fascicular block.  Possible LVH. Telemetry:  Telemetry was personally reviewed and demonstrates: Sinus rhythm heart rates in the 80s. PVCs.  Relevant CV Studies: Echocardiogram 04/21/2023 1. Left ventricular ejection fraction, by estimation, is 25 to 30%. The  left ventricle has severely decreased function. The left ventricle  demonstrates regional wall motion abnormalities with mid to apical severe  hypokinesis to akinesis, mid to apical  anterior akinesis, akinesis of the apex and peri-apical segments. This  suggests prior LAD territory infarction. No LV thrombus noted. The left  ventricular internal cavity size was mildly dilated. Left ventricular  diastolic parameters are consistent with  Grade II diastolic dysfunction (pseudonormalization). Elevated left atrial  pressure.   2. Right ventricular systolic function is mildly reduced. The right  ventricular size is normal. There is severely elevated pulmonary artery  systolic pressure. The estimated right ventricular systolic pressure is  62.1 mmHg.   3. Left atrial size was mildly dilated.   4. Right atrial size was mildly dilated.   5. The mitral valve is normal in structure. Trivial mitral valve  regurgitation. No evidence of mitral stenosis.   6. The aortic valve is tricuspid. There is mild calcification of the   aortic valve. Aortic valve regurgitation is mild. No aortic stenosis is  present.   7. Aortic dilatation noted. There is mild dilatation of the aortic root,  measuring 38 mm. There is mild dilatation of the ascending aorta,  measuring 42 mm.   8. The inferior vena cava is dilated in size with <50% respiratory  variability, suggesting right atrial pressure of 15 mmHg.    Laboratory Data:  High Sensitivity Troponin:   Recent Labs  Lab 04/20/23 1557 04/20/23 1756  TROPONINIHS 18* 19*     Chemistry Recent Labs  Lab 04/20/23 1136 04/20/23 1756 04/21/23 0124  NA 138  --  140  K 4.1  --  4.1  CL 104  --  104  CO2 27  --  27  GLUCOSE 94  --  114*  BUN 14  --  11  CREATININE 1.18 1.00 1.26*  CALCIUM  9.0  --  9.0  GFRNONAA >60 >60 58*  ANIONGAP 7  --  9    No results for input(s): PROT, ALBUMIN, AST, ALT, ALKPHOS, BILITOT in the last 168 hours. Lipids  Recent Labs  Lab 04/21/23 0124  CHOL 167  TRIG 54  HDL 37*  LDLCALC 119*  CHOLHDL 4.5    Hematology Recent Labs  Lab 04/20/23 1136 04/20/23 1938 04/21/23 0124  WBC 3.9* 4.4 4.8  RBC 4.77 4.96 5.01  HGB 14.7 14.8 14.9  HCT 44.3 45.3 45.0  MCV 92.9 91.3 89.8  MCH 30.8 29.8 29.7  MCHC 33.2 32.7 33.1  RDW 15.7* 15.8* 15.8*  PLT 188 183 200   Thyroid  Recent Labs  Lab 04/21/23 1012  TSH 1.415    BNP Recent Labs  Lab 04/20/23 1557  BNP 973.3*    DDimer No results for input(s): DDIMER in the last 168 hours.   Radiology/Studies:  VAS US  LOWER EXTREMITY VENOUS (DVT) Result Date: 04/21/2023  Lower Venous DVT Study Patient Name:  Kevin Flynn  Date of Exam:   04/21/2023 Medical Rec #: [id number]      Accession #:    [id number] Date of Birth: [birth year]       Patient Gender: M Patient Age:   39 years  Exam Location:  [facility] Procedure:      VAS US  LOWER EXTREMITY VENOUS (DVT) Referring Phys: [facility] GOEL --------------------------------------------------------------------------------   Indications: Swelling, Edema, and Numbness and weakness in right lower extremity.  Comparison Study: Previous study of left lower extremity on 4.30.2009. Performing Technologist: Edilia Elden Appl  Examination Guidelines: A complete evaluation includes B-mode imaging, spectral Doppler, color Doppler, and power Doppler as needed of all accessible portions of each vessel. Bilateral testing is considered an integral part of a complete examination. Limited examinations for reoccurring indications may be performed as noted. The reflux portion of the exam is performed with the patient in reverse Trendelenburg.  +---------+---------------+---------+-----------+----------+--------------+ RIGHT    CompressibilityPhasicitySpontaneityPropertiesThrombus Aging +---------+---------------+---------+-----------+----------+--------------+ CFV      Full           Yes      Yes                                 +---------+---------------+---------+-----------+----------+--------------+ SFJ      Full           Yes      Yes                                 +---------+---------------+---------+-----------+----------+--------------+ FV Prox  Full                                                        +---------+---------------+---------+-----------+----------+--------------+ FV Mid   Full                                                        +---------+---------------+---------+-----------+----------+--------------+ FV DistalFull                                                        +---------+---------------+---------+-----------+----------+--------------+ PFV      Full                                                        +---------+---------------+---------+-----------+----------+--------------+ POP      Full           Yes      Yes                                 +---------+---------------+---------+-----------+----------+--------------+ PTV      Full                                                         +---------+---------------+---------+-----------+----------+--------------+ PERO  Full                                                        +---------+---------------+---------+-----------+----------+--------------+   +---------+---------------+---------+-----------+----------+--------------+ LEFT     CompressibilityPhasicitySpontaneityPropertiesThrombus Aging +---------+---------------+---------+-----------+----------+--------------+ CFV      Full           Yes      Yes                                 +---------+---------------+---------+-----------+----------+--------------+ SFJ      Full           Yes      Yes                                 +---------+---------------+---------+-----------+----------+--------------+ FV Prox  Full                                                        +---------+---------------+---------+-----------+----------+--------------+ FV Mid   Full                                                        +---------+---------------+---------+-----------+----------+--------------+ FV DistalFull                                                        +---------+---------------+---------+-----------+----------+--------------+ PFV      Full                                                        +---------+---------------+---------+-----------+----------+--------------+ POP      Full           Yes      Yes                                 +---------+---------------+---------+-----------+----------+--------------+ PTV      Full                                                        +---------+---------------+---------+-----------+----------+--------------+ PERO     Full                                                        +---------+---------------+---------+-----------+----------+--------------+  Summary: BILATERAL: - No evidence of deep vein thrombosis seen in the lower  extremities, bilaterally. -No evidence of popliteal cyst, bilaterally.   *See table(s) above for measurements and observations.    Preliminary    ECHOCARDIOGRAM COMPLETE Result Date: 04/21/2023    ECHOCARDIOGRAM REPORT   Patient Name:   Kevin Flynn Date of Exam: 04/21/2023 Medical Rec #:  [id number]     Height:       68.0 in Accession #:    [id number]    Weight:       214.1 lb Date of Birth:  Feb 18, 1946      BSA:          2.104 m Patient Age:    77 years      BP:           140/96 mmHg Patient Gender: M             HR:           85 bpm. Exam Location:  Inpatient Procedure: 2D Echo, Cardiac Doppler and Color Doppler Indications:   Stroke  History:       Patient has no prior history of Echocardiogram examinations.                Stroke.  Sonographer:   Lanell Maduro Referring      [id number] [facility] GOEL Phys: IMPRESSIONS  1. Left ventricular ejection fraction, by estimation, is 25 to 30%. The left ventricle has severely decreased function. The left ventricle demonstrates regional wall motion abnormalities with mid to apical severe hypokinesis to akinesis, mid to apical anterior akinesis, akinesis of the apex and peri-apical segments. This suggests prior LAD territory infarction. No LV thrombus noted. The left ventricular internal cavity size was mildly dilated. Left ventricular diastolic parameters are consistent with Grade II diastolic dysfunction (pseudonormalization). Elevated left atrial pressure.  2. Right ventricular systolic function is mildly reduced. The right ventricular size is normal. There is severely elevated pulmonary artery systolic pressure. The estimated right ventricular systolic pressure is 62.1 mmHg.  3. Left atrial size was mildly dilated.  4. Right atrial size was mildly dilated.  5. The mitral valve is normal in structure. Trivial mitral valve regurgitation. No evidence of mitral stenosis.  6. The aortic valve is tricuspid. There is mild calcification of the aortic valve. Aortic valve  regurgitation is mild. No aortic stenosis is present.  7. Aortic dilatation noted. There is mild dilatation of the aortic root, measuring 38 mm. There is mild dilatation of the ascending aorta, measuring 42 mm.  8. The inferior vena cava is dilated in size with <50% respiratory variability, suggesting right atrial pressure of 15 mmHg. FINDINGS  Left Ventricle: Left ventricular ejection fraction, by estimation, is 25 to 30%. The left ventricle has severely decreased function. The left ventricle demonstrates regional wall motion abnormalities. The left ventricular internal cavity size was mildly  dilated. There is no left ventricular hypertrophy. Left ventricular diastolic parameters are consistent with Grade II diastolic dysfunction (pseudonormalization). Elevated left atrial pressure. Right Ventricle: The right ventricular size is normal. No increase in right ventricular wall thickness. Right ventricular systolic function is mildly reduced. There is severely elevated pulmonary artery systolic pressure. The tricuspid regurgitant velocity is 3.43 m/s, and with an assumed right atrial pressure of 15 mmHg, the estimated right ventricular systolic pressure is 62.1 mmHg. Left Atrium: Left atrial size was mildly dilated. Right Atrium: Right atrial size was mildly dilated. Pericardium: There is no evidence of pericardial effusion. Mitral  Valve: The mitral valve is normal in structure. Mild mitral annular calcification. Trivial mitral valve regurgitation. No evidence of mitral valve stenosis. Tricuspid Valve: The tricuspid valve is normal in structure. Tricuspid valve regurgitation is mild. Aortic Valve: The aortic valve is tricuspid. There is mild calcification of the aortic valve. Aortic valve regurgitation is mild. Aortic regurgitation PHT measures 490 msec. No aortic stenosis is present. Pulmonic Valve: The pulmonic valve was normal in structure. Pulmonic valve regurgitation is mild. Aorta: Aortic dilatation noted. There  is mild dilatation of the aortic root, measuring 38 mm. There is mild dilatation of the ascending aorta, measuring 42 mm. Venous: The inferior vena cava is dilated in size with less than 50% respiratory variability, suggesting right atrial pressure of 15 mmHg. IAS/Shunts: No atrial level shunt detected by color flow Doppler.  LEFT VENTRICLE PLAX 2D LVIDd:         6.10 cm      Diastology LVIDs:         4.90 cm      LV e' medial:    4.66 cm/s LV PW:         1.00 cm      LV E/e' medial:  24.7 LV IVS:        0.70 cm      LV e' lateral:   6.99 cm/s LVOT diam:     2.30 cm      LV E/e' lateral: 16.5 LV SV:         42 LV SV Index:   20 LVOT Area:     4.15 cm  LV Volumes (MOD) LV vol d, MOD A2C: 166.0 ml LV vol d, MOD A4C: 178.0 ml LV vol s, MOD A2C: 105.0 ml LV vol s, MOD A4C: 124.0 ml LV SV MOD A2C:     61.0 ml LV SV MOD A4C:     178.0 ml LV SV MOD BP:      54.1 ml RIGHT VENTRICLE            IVC RV Basal diam:  3.70 cm    IVC diam: 2.60 cm RV S prime:     6.99 cm/s TAPSE (M-mode): 2.2 cm LEFT ATRIUM             Index        RIGHT ATRIUM           Index LA diam:        3.80 cm 1.81 cm/m   RA Area:     20.00 cm LA Vol (A2C):   77.1 ml 36.65 ml/m  RA Volume:   54.60 ml  25.96 ml/m LA Vol (A4C):   73.4 ml 34.89 ml/m LA Biplane Vol: 76.4 ml 36.32 ml/m  AORTIC VALVE             PULMONIC VALVE LVOT Vmax:   67.10 cm/s  PR End Diast Vel: 1.67 msec LVOT Vmean:  45.500 cm/s LVOT VTI:    0.100 m AI PHT:      490 msec  AORTA Ao Root diam: 3.80 cm Ao Asc diam:  4.20 cm MITRAL VALVE                TRICUSPID VALVE MV Area (PHT): 5.02 cm     TR Peak grad:   47.1 mmHg MV Decel Time: 151 msec     TR Vmax:        343.00 cm/s MR Peak grad: 85.0 mmHg MR Mean grad: 56.0 mmHg  SHUNTS MR Vmax:      461.00 cm/s   Systemic VTI:  0.10 m MR Vmean:     352.0 cm/s    Systemic Diam: 2.30 cm MV E velocity: 115.00 cm/s Dalton McleanMD Electronically signed by Ezra Kanner Signature Date/Time: 04/21/2023/9:54:03 AM    Final    CT CHEST WO  CONTRAST Result Date: 04/20/2023 CLINICAL DATA:  Neural effusion EXAM: CT CHEST WITHOUT CONTRAST TECHNIQUE: Multidetector CT imaging of the chest was performed following the standard protocol without IV contrast. RADIATION DOSE REDUCTION: This exam was performed according to the departmental dose-optimization program which includes automated exposure control, adjustment of the mA and/or kV according to patient size and/or use of iterative reconstruction technique. COMPARISON:  Chest x-ray 04/20/2023 FINDINGS: Cardiovascular: Limited assessment without intravenous contrast. Mild aortic atherosclerosis. Maximum ascending aortic diameter of 4.1 cm. Coronary vascular calcifications. Cardiomegaly. No sizable pericardial effusion Mediastinum/Nodes: Patent trachea. No thyroid mass. Multiple borderline mediastinal lymph nodes. Right paratracheal nodes measuring up to 11 mm. Esophagus within normal limits Lungs/Pleura: Small right greater than left pleural effusions. Passive atelectasis in the lower lobes. Upper Abdomen: No acute finding. Right adrenal nodule measuring 3 cm with density value of -0.8. Diffusely thickened left adrenal gland. 2.1 cm left adrenal nodule with density value of 1.4. Excreted contrast within the kidneys 2.1 cm left adrenal nodule with density value of 1.4. Musculoskeletal: No acute osseous abnormality IMPRESSION: 1. Small right greater than left pleural effusions with passive atelectasis in the lower lobes. 2. Cardiomegaly. Coronary vascular calcifications. 3. Borderline mediastinal lymph nodes, nonspecific. 4. Bilateral adrenal adenomas for which no imaging follow-up is recommended. 5. Aortic atherosclerosis. Aortic Atherosclerosis (ICD10-I70.0). Electronically Signed   By: Luke Bun M.D.   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 Multiplanar CT image reconstructions and MIPs were obtained to evaluate the vascular anatomy. Carotid stenosis measurements (when applicable) are obtained utilizing NASCET criteria, using the distal internal carotid diameter as the denominator. RADIATION DOSE REDUCTION: This exam was performed according to the departmental dose-optimization program which includes automated exposure control, adjustment of the mA and/or kV according to patient size and/or use of iterative reconstruction technique. CONTRAST:  OMNIPAQUE  IOHEXOL  350 MG/ML SOLN COMPARISON:  No prior CT head or CTA head and neck available, correlation is made with MRI head 04/20/2023 FINDINGS: Evaluation is limited by bolus timing, despite multiple attempts to time the contrast. Given the slow is with which the contrast passed from the pulmonary arteries to the aorta, a cardiac etiology is suspected CT HEAD FINDINGS Brain: The acute and subacute infarcts seen on MRI are not appreciated on CT. No evidence of additional acute infarct, hemorrhage, mass, mass effect, or midline shift. No hydrocephalus or extra-axial fluid collection. Vascular: No hyperdense vessel. Skull: Negative for fracture or focal lesion. Sinuses/Orbits: No acute finding. Other: The mastoid air cells are well aerated. CTA NECK FINDINGS Aortic arch: The aortic arch is incompletely included in the field of view. Two-vessel arch with a common origin of the brachiocephalic and left common carotid arteries. Imaged portion shows no evidence of aneurysm or dissection. Right carotid system: No evidence of dissection, occlusion, or hemodynamically significant stenosis (greater  than 50%). Left carotid system: No evidence of dissection, occlusion, or hemodynamically significant stenosis (greater than 50%).  Vertebral arteries: The vertebral arteries are not sufficiently opacified for adequate evaluation. Skeleton: No acute osseous abnormality. Degenerative changes in the cervical spine. Other neck: No acute finding. Upper chest: No focal pulmonary opacity or pleural effusion. Review of the MIP images confirms the above findings CTA HEAD FINDINGS Evaluation is limited by bolus timing, as described above, which primarily limits evaluation of the posterior circulation. Anterior circulation: Both internal carotid arteries are patent to the termini, with mild stenosis in the right cavernous and supraclinoid ICA (series 18, images 196, 2 2, and 209) Aplastic right A1. Patent left A1. 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 Anterior cerebral arteries are patent through the A2 segments, with evaluation of more distal segments limited by bolus timing. Moderate stenosis in the distal left M1 (series 18, image 211). No significant stenosis right M1. The M2 segments and proximal M3 segments are perfused without significant stenosis. Posterior circulation: Poor visualization of the bilateral vertebral arteries, which may be due to bolus timing as well as existing stenosis. The distal basilar artery is patent but diminutive. Patent left P1. Fetal origin of the right PCA and near fetal origin of the left PCA from the posterior communicating arteries. Severe stenosis in the mid right P2 (series 18, image 206). A severe stenosis in the proximal left P2 (series 18, image 211). Evaluation past the P2 segments is limited by bolus timing. Venous sinuses: As permitted by contrast timing, patent. Anatomic variants: Aplastic right A1. Fetal right PCA. Near fetal left PCA. No evidence of vascular malformation. Review of the MIP images confirms the above findings IMPRESSION: 1. Evaluation is limited by bolus timing, despite multiple attempts to time the contrast. Given the slow is  with which the contrast passed from the pulmonary arteries to the aorta, a cardiac etiology is suspected. 2. The intracranial and extracranial vertebral arteries are not sufficiently opacified for adequate evaluation. The PCAs are primarily supplied by the posterior communicating arteries. Opacification of the distal basilar artery may be retrograde. 3. Moderate stenosis in the distal left M1. Mild stenosis right cavernous and supraclinoid ICA. 4. Severe stenosis in the mid right P2 and proximal left P2. 5. 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 Electronically Signed   By: Donald Campion M.D.   On: 04/20/2023 16:58   MR BRAIN WO CONTRAST Result Date: 04/20/2023 CLINICAL DATA:  Right-sided numbness in the arm and leg for 2 weeks, slurred speech and memory fog, right limb, worsening right hand tremor EXAM: MRI HEAD WITHOUT CONTRAST MRI CERVICAL SPINE WITHOUT CONTRAST TECHNIQUE: Multiplanar, multiecho pulse sequences of the brain and surrounding structures, and cervical spine, to include the craniocervical junction and cervicothoracic junction, were obtained without intravenous contrast. COMPARISON:  None Available. FINDINGS: MRI HEAD FINDINGS Brain: Increased signal on diffusion-weighted imaging, with ADC correlates, in the posterior left frontal cortex (series 5, images 37-44 and series 13, images 15-18), which includes the motor strip. The intensity of the diffusion restriction varies, suggesting both acute and subacute infarcts. These areas are associated with increased T2 hyperintense signal. No acute hemorrhage, mass, mass effect, or midline shift. No hydrocephalus or extra-axial collection. Pituitary and craniocervical junction within normal limits. No hemosiderin deposition to suggest remote hemorrhage. Age related cerebral atrophy. Scattered and confluent T2 hyperintense signal in the periventricular white matter, likely the sequela  of mild-to-moderate chronic  small vessel ischemic disease. Prominent dilated perivascular spaces in the cortex and basal ganglia. Remote lacunar infarct in pons. Vascular: Loss of the left vertebral artery flow void (series 8, image 4). Otherwise normal arterial flow voids. Skull and upper cervical spine: Normal marrow signal. Sinuses/Orbits: Clear paranasal sinuses. No acute finding in the orbits. Other: The mastoid air cells are well aerated. MRI CERVICAL SPINE FINDINGS Alignment: Straightening of the normal cervical lordosis. No significant listhesis. Levocurvature of the proximal thoracic spine. Vertebrae: No acute fracture, evidence of discitis, or suspicious osseous lesion. Cord: Normal signal and morphology. Posterior Fossa, vertebral arteries, paraspinal tissues: Loss of the left vertebral artery flow void in the V2 and V3 segments. Otherwise negative. Disc levels: C2-C3: No significant disc bulge. No spinal canal stenosis or neuroforaminal narrowing. C3-C4: No significant disc bulge. Right greater than left facet and uncovertebral hypertrophy. No spinal canal stenosis. Moderate right neural foraminal narrowing. C4-C5: No significant disc bulge. No spinal canal stenosis or neuroforaminal narrowing. C5-C6: No significant disc bulge. No spinal canal stenosis or neuroforaminal narrowing. C6-C7: Minimal disc bulge. Uncovertebral hypertrophy. No spinal canal stenosis. Mild left neural foraminal narrowing. C7-T1: No significant disc bulge. No spinal canal stenosis or neuroforaminal narrowing. IMPRESSION: 1. Acute and subacute infarcts in the posterior left frontal cortex, which includes the motor strip. 2. Loss of the left vertebral artery flow void in the V2, V3, and V4 segments, which is concerning for occlusion or slow flow. This could be further evaluated with a CTA of the head and neck. 3. C3-C4 moderate right neural foraminal narrowing. 4. C6-C7 mild left neural foraminal narrowing. 5. No spinal canal stenosis. These results were  called by telephone at the time of interpretation on 04/20/2023 at 3:22 pm to provider JULIE HAVILAND , who verbally acknowledged these results. Electronically Signed   By: Donald Campion M.D.   On: 04/20/2023 15:23   MR Cervical Spine Wo Contrast Result Date: 04/20/2023 CLINICAL DATA:  Right-sided numbness in the arm and leg for 2 weeks, slurred speech and memory fog, right limb, worsening right hand tremor EXAM: MRI HEAD WITHOUT CONTRAST MRI CERVICAL SPINE WITHOUT CONTRAST TECHNIQUE: Multiplanar, multiecho pulse sequences of the brain and surrounding structures, and cervical spine, to include the craniocervical junction and cervicothoracic junction, were obtained without intravenous contrast. COMPARISON:  None Available. FINDINGS: MRI HEAD FINDINGS Brain: Increased signal on diffusion-weighted imaging, with ADC correlates, in the posterior left frontal cortex (series 5, images 37-44 and series 13, images 15-18), which includes the motor strip. The intensity of the diffusion restriction varies, suggesting both acute and subacute infarcts. These areas are associated with increased T2 hyperintense signal. No acute hemorrhage, mass, mass effect, or midline shift. No hydrocephalus or extra-axial collection. Pituitary and craniocervical junction within normal limits. No hemosiderin deposition to suggest remote hemorrhage. Age related cerebral atrophy. Scattered and confluent T2 hyperintense signal in the periventricular white matter, likely the sequela of mild-to-moderate chronic small vessel ischemic disease. Prominent dilated perivascular spaces in the cortex and basal ganglia. Remote lacunar infarct in pons. Vascular: Loss of the left vertebral artery flow void (series 8, image 4). Otherwise normal arterial flow voids. Skull and upper cervical spine: Normal marrow signal. Sinuses/Orbits: Clear paranasal sinuses. No acute finding in the orbits. Other: The mastoid air cells are well aerated. MRI CERVICAL SPINE FINDINGS  Alignment: Straightening of the normal cervical lordosis. No significant listhesis. Levocurvature of the proximal thoracic spine. Vertebrae: No acute fracture, evidence of discitis, or suspicious osseous lesion.  Cord: Normal signal and morphology. Posterior Fossa, vertebral arteries, paraspinal tissues: Loss of the left vertebral artery flow void in the V2 and V3 segments. Otherwise negative. Disc levels: C2-C3: No significant disc bulge. No spinal canal stenosis or neuroforaminal narrowing. C3-C4: No significant disc bulge. Right greater than left facet and uncovertebral hypertrophy. No spinal canal stenosis. Moderate right neural foraminal narrowing. C4-C5: No significant disc bulge. No spinal canal stenosis or neuroforaminal narrowing. C5-C6: No significant disc bulge. No spinal canal stenosis or neuroforaminal narrowing. C6-C7: Minimal disc bulge. Uncovertebral hypertrophy. No spinal canal stenosis. Mild left neural foraminal narrowing. C7-T1: No significant disc bulge. No spinal canal stenosis or neuroforaminal narrowing. IMPRESSION: 1. Acute and subacute infarcts in the posterior left frontal cortex, which includes the motor strip. 2. Loss of the left vertebral artery flow void in the V2, V3, and V4 segments, which is concerning for occlusion or slow flow. This could be further evaluated with a CTA of the head and neck. 3. C3-C4 moderate right neural foraminal narrowing. 4. C6-C7 mild left neural foraminal narrowing. 5. No spinal canal stenosis. These results were called by telephone at the time of interpretation on 04/20/2023 at 3:22 pm to provider JULIE HAVILAND , who verbally acknowledged these results. Electronically Signed   By: Donald Campion M.D.   On: 04/20/2023 15:23   DG Chest Portable 1 View Result Date: 04/20/2023 CLINICAL DATA:  Shortness of breath EXAM: PORTABLE CHEST 1 VIEW COMPARISON:  None Available. FINDINGS: Enlarged cardiopericardial silhouette with vascular congestion. Small effusions. Lung  base opacities identified particularly left retrocardiac. Infiltrates possible. No pneumothorax. Films are under penetrated. Prominent degenerative changes of the spine IMPRESSION: Enlarged cardiopericardial silhouette with vascular congestion. Small effusions with some lung base opacities. Possible infiltrate. Recommend follow up Electronically Signed   By: Ranell Bring M.D.   On: 04/20/2023 14:01     Assessment and Plan:   New onset HFrEF Currently being evaluated for embolic left motor strip stroke.  Echocardiogram shows LVEF newly reduced EF 25 to 30% with regional wall motion abnormalities consistent with possible LAD infarct.  No LV thrombus was noted.  Grade 2 diastolic dysfunction, mildly reduced RV function with severely elevated PASP with a RVSP of 62.  Small pleural effusion also noted.  Overall mildly volume up but had RA pressure of 15 with dilated IVC, still short of breath. Eventually will need right and left heart catheterization once more stable to further define cardiomyopathy etiology. Likely can be done outpatient.   Give 1 dose of IV Lasix  20 mg GDMT: start farxiga  10, losartan  25 mg, Toprol -XL 25 mg.  Avoid hypotension with acute/subacute stroke. Repeat echocardiogram in 3 months.  Hyperlipidemia LDL 119. Started on atorvastatin  40 mg.  Repeat LDL in 6 to 8 weeks.  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  See below.  No TEE per neurology given starting on anticoagulation.  OSA He is on CPAP while inpatient.  Continue to encourage at home.  Addendum: Stroke felt to be cardioembolic given reduced EF.  PFO less likely given age.  Neurology will start him on DOAC.  He will need repeat echocardiogram in 3 months.  If at that time LVEF is greater than 35% he can stop the DOAC and transition to aspirin  and place on heart monitor at that time.  I will arrange follow-up in 5 weeks.  Risk Assessment/Risk Scores:   [place]   Heart Association (NYHA) Functional Class NYHA Class II  For questions or updates, please contact [facility] Please consult [email or web address] for contact info under    Signed, Thom LITTIE Sluder, PA-C  04/21/2023 2:01 PM   ADDENDUM:   Patient seen and examined with Thom Sluder.  I personally taken a history, examined the patient, reviewed relevant notes,  laboratory data / imaging studies.  I performed a substantive portion of this encounter and formulated the important aspects of the plan.  I agree with the APP's note, impression, and recommendations; however, I have edited the note to reflect changes or salient points.   Patient presents to the hospital with focal neurological deficits concerns for stroke. Workup so far has noted acute subacute CVA and currently under the care of primary and neurology.   During the stroke workup patient had an echocardiogram which notes severe reduction in LVEF, regional wall motion abnormalities concerning for LAD disease, and diastolic dysfunction.  Cardiology was consulted given the new onset of cardiomyopathy/further workup/recommendations.  At the time of evaluation patient is free of angina pectoris and does not endorse heart failure symptoms of orthopnea, PND, lower extremity swelling. No family is present at bedside.   PHYSICAL EXAM: Today's Vitals   04/21/23 0600 04/21/23 0800 04/21/23 1216 04/21/23 1504  BP:   121/86 (!) 138/100  Pulse:   88 95  Resp:   20   Temp:    97.7 F (36.5 C)  TempSrc:   Oral Oral  SpO2:   97% 91%  Weight: 97.1 kg     Height:      PainSc:  0-No pain     Body mass index is 32.55 kg/m.   Net IO Since Admission: 460 mL [04/21/23 1819]  Filed Weights   04/20/23 1102 04/20/23 1842 04/21/23 0600  Weight: 98.9 kg 98.9 kg 97.1 kg    Physical Exam  Constitutional: No distress.  hemodynamically stable  Neck: No JVD present.  Cardiovascular: Normal rate, regular rhythm, S1 normal and S2 normal. Exam  reveals no gallop, no S3 and no S4.  No murmur heard. Pulmonary/Chest: Effort normal and breath sounds normal. No stridor. He has no wheezes. He has no rales.  Abdominal: Soft. Bowel sounds are normal. He exhibits no distension. There is no abdominal tenderness.  Musculoskeletal:        General: Edema (Trace bilateral) present.     Cervical back: Neck supple.  Neurological: He is alert and oriented to person, place, and time.  Strength is 3+ in all 4 extremities.  Skin: Skin is warm.    EKG: (personally reviewed by me) 04/20/2023: Sinus rhythm, 92 bpm, LVH per voltage criteria, first-degree AV, LAE, left anterior fascicular block, rare PVC,  Telemetry: (personally reviewed by me) SR    Impression:  New onset of cardiomyopathy and HFrEF  Acute / Subacute CVA  IDDM Type II  HLD, mixed  Former smoker OSA  Recommendations:  New onset of cardiomyopathy and HFrEF: Newly discovered cardiomyopathy in the setting of stroke workup. Echo cardiogram personally reviewed which notes severely reduced LVEF with regional wall motion normalities concerning for possible LAD insult. Clinically he denies anginal chest pain now or in the recent past. EKG is nonischemic. Troponins are essentially flat not suggestive of ACS. In the setting of acute/subacute stroke, currently asymptomatic, and ECG not illustrating ACS recommend conservative management for the first 4 weeks as he recovers from the recent stroke. During this time recommend up titration of antianginal and GDMT. Start Farxiga  10 mg p.o. daily. Start losartan  25 mg  p.o. daily. Start Toprol -XL 25 mg p.o. daily. Will give IV Lasix  20 mg x 1.  Will reevaluate volume status and diuretics tomorrow. Spoke to Dr. Jerri from neurology to help coordinate care given his presentation of acute/subacute stroke and newly discovered cardiomyopathy.  Given his age and risk factors neurology does not recommend a TEE, transcranial Doppler, hypercoagulable workup   at this time.  It is felt that his stroke is likely cardioembolic.  Neurology will be starting anticoagulation. And recommend to continue for 3 months.  Repeat TTE in 3 months if LVEF >35% stop anticoagulation and transiiton to ASA and cardiac monitor at that time.  If the LVEF remains <30-35% at the 69-month echo neurology recommends continuation of anticoagulation. At follow-up would recommend ischemic evaluation likely cardiac PET/CT to evaluate for reversible perfusion defects, myocardial blood flow, CAC.  Recommendations may change based on clinical trajectory as well. Hemoglobin A1c is 5.6. Lipids reviewed, LDL is 119 mg/dL.  Agree/recommend statin therapy. Triglycerides are well-controlled  Further recommendations to follow as the case evolves.   This note was created using a voice recognition software as a result there may be grammatical errors inadvertently enclosed that do not reflect the nature of this encounter. Every attempt is made to correct such errors.   Madonna Large, DO, [facility]  [address] [place], [place] [address] Pager: [phone] Office: [phone] 04/21/2023 6:19 PM

## 2023-04-21 NOTE — Evaluation (Signed)
 Speech Language Pathology Evaluation Patient Details Name: Kevin Flynn MRN: [id number] DOB: 11/30/1945 Today's Date: 04/21/2023 Time:  -     Problem List:  Patient Active Problem List   Diagnosis Date Noted   Stroke (cerebrum) (HCC) 04/20/2023   Leukopenia 04/20/2023   Pleural effusion 04/20/2023   Past Medical History:  Past Medical History:  Diagnosis Date   Depression    Diabetes mellitus without complication (HCC)    Hypertension    Past Surgical History:  Past Surgical History:  Procedure Laterality Date   KNEE SURGERY Left tendon repair   HPI:  78 y.o. male presented to ED 1/9 with complaints of right sided weakness UE>LE that onset a week ago. Pt with RR 30 awaiting COVID test results. 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 EFY:ipjazuzd mellitus, hypertension.   Assessment / Plan / Recommendation Clinical Impression  Pt's oromotor and speech intelligibility are within normal limits. Both pt and wife feel his cognition is at baseline. Pt given the Cognistat standardized test and he scored within average range on each subtest. He does however forget to take his night time medicines sometimes which  wife confirmed. Educated/discussed setting alarms/reminders for scheduled medication time and wife assist if needed; pt receptive to strategy. Pt does not need further ST and pt/wife in agreement.    SLP Assessment  SLP Recommendation/Assessment: Patient does not need any further Speech Lanaguage Pathology Services SLP Visit Diagnosis: Cognitive communication deficit (R41.841)    Recommendations for follow up therapy are one component of a multi-disciplinary discharge planning process, led by the attending physician.  Recommendations may be updated based on patient status, additional functional criteria and insurance authorization.    Follow  Up Recommendations  No SLP follow up    Assistance Recommended at Discharge  Set up Supervision/Assistance  Functional Status Assessment Patient has not had a recent decline in their functional status  Frequency and Duration           SLP Evaluation Cognition  Overall Cognitive Status: Within Functional Limits for tasks assessed Arousal/Alertness: Awake/alert Orientation Level: Oriented X4 Year: 2025 Month: January Day of Week: Correct Attention: Sustained Sustained Attention: Appears intact Memory: Appears intact (4/4 words correct) Awareness: Appears intact Problem Solving: Appears intact Safety/Judgment: Other (comment) (see impression statement)       Comprehension  Auditory Comprehension Overall Auditory Comprehension: Appears within functional limits for tasks assessed Visual Recognition/Discrimination Discrimination: Not tested Reading Comprehension Reading Status: Not tested    Expression Expression Primary Mode of Expression: Verbal Verbal Expression Overall Verbal Expression: Appears within functional limits for tasks assessed Initiation: No impairment Level of Generative/Spontaneous Verbalization: Conversation Repetition: No impairment Naming: No impairment Pragmatics: No impairment Written Expression Written Expression: Not tested   Oral / Motor  Oral Motor/Sensory Function Overall Oral Motor/Sensory Function: Within functional limits Motor Speech Overall Motor Speech: Appears within functional limits for tasks assessed Respiration: Within functional limits Phonation: Normal Resonance: Within functional limits Articulation: Within functional limitis Intelligibility: Intelligible Motor Planning: Witnin functional limits Motor Speech Errors: Not applicable            Dustin Olam Bull 04/21/2023, 1:31 PM

## 2023-04-21 NOTE — Consult Note (Signed)
 NEUROLOGY CONSULT NOTE   Date of service: April 21, 2023 Patient Name: Kevin Flynn MRN:  [id number] DOB:  1945/06/15 Chief Complaint: Right hand numbness Requesting Provider: Moody Alto, MD  History of Present Illness  Kevin Flynn is a 78 y.o. right-handed man with a past medical history significant for hypertension, hyperlipidemia, type 2 diabetes, obstructive sleep apnea not currently adherent to CPAP, intermittent marijuana use, very remote 4-pack-year history of smoking, recent daily alcohol use (stopped 05/10/2022 per his report)  He [place] when he suddenly could not use his right hand to push the levers on the machine anymore.  He started using his left hand.  His wife felt that his speech was a bit slurred and she also feels like he has been dragging his feet for a slightly longer period of time (1 month or so)  He notes that otherwise he has been in his normal state of health except that since he went on a vacation to [place]  in September 2024 he stopped using his CPAP machine while on the cruise (previously used to use it every night), lost his sense of taste and smell and in that setting has had reduced appetite associated with a 25 pound weight loss.  He reports he also stopped smoking marijuana around that time.  He has had some personal stressors recently with his best friend's brother passing away last week after nephrectomy  He notes 1 year of constipation, going every 2 to 3 days when he used to go daily previous to that.  He notes some dream enactment behaviors, leading to his wife sleeping in a separate bed (many years).  He is followed by a neurologist in [place] through the [facility] and does have baseline tremor.   LKW: 1/30 Modified rankin score: 1 IV Thrombolysis: No, out of the window EVT: No, exam not c/w LVO   NIHSS components Score: Comment  1a Level of Conscious 0[x]  1[]  2[]  3[]      1b LOC Questions 0[x]  1[]  2[]       1c LOC Commands  0[x]  1[]  2[]       2 Best Gaze 0[x]  1[]  2[]       3 Visual 0[x]  1[]  2[]  3[]      4 Facial Palsy 0[x]  1[]  2[]  3[]      5a Motor Arm - left 0[x]  1[]  2[]  3[]  4[]  UN[]    5b Motor Arm - Right 0[x]  1[]  2[]  3[]  4[]  UN[]    6a Motor Leg - Left 0[x]  1[]  2[]  3[]  4[]  UN[]    6b Motor Leg - Right 0[x]  1[]  2[]  3[]  4[]  UN[]    7 Limb Ataxia 0[x]  1[]  2[]  3[]  UN[]     8 Sensory 0[x]  1[]  2[]  UN[]    Denies any anesthesia of the right hand  9 Best Language 0[x]  1[]  2[]  3[]      10 Dysarthria 0[x]  1[]  2[]  UN[]      11 Extinct. and Inattention 0[x]  1[]  2[]       TOTAL: 0       ROS  Comprehensive ROS performed and pertinent positives documented in HPI    Past History   Past Medical History:  Diagnosis Date   Depression    Diabetes mellitus without complication (HCC)    Hypertension     Past Surgical History:  Procedure Laterality Date   KNEE SURGERY Left tendon repair    Family History: He reports his mother died in 70 of cancer and his brother died in 81 of lung cancer (brother was  a heavy smoker)  Social History  reports that he has quit smoking. He has never used smokeless tobacco. He reports current alcohol use. He reports that he does not use drugs.  Other than marijuana occasionally  No Known Allergies  Medications   Current Facility-Administered Medications:     stroke: early stages of recovery book, , Does not apply, Once, Moody Alto, MD   acetaminophen  (TYLENOL ) tablet 650 mg, 650 mg, Oral, Q6H PRN **OR** acetaminophen  (TYLENOL ) suppository 650 mg, 650 mg, Rectal, Q6H PRN, Moody Alto, MD   Ampicillin -Sulbactam (UNASYN ) 3 g in sodium chloride  0.9 % 100 mL IVPB, 3 g, Intravenous, Q6H, Moody Alto, MD, Stopped at 04/21/23 0235   aspirin  tablet 325 mg, 325 mg, Oral, Daily, Goel, Hersh, MD, 325 mg at 04/20/23 1715   atorvastatin  (LIPITOR) tablet 40 mg, 40 mg, Oral, Daily, Goel, Hersh, MD, 40 mg at 04/20/23 1927   enoxaparin  (LOVENOX ) injection 40 mg, 40 mg, Subcutaneous, Q24H, Goel, Hersh,  MD   feeding supplement (ENSURE ENLIVE / ENSURE PLUS) liquid 237 mL, 237 mL, Oral, BID BM, Goel, Hersh, MD   insulin  aspart (novoLOG ) injection 0-15 Units, 0-15 Units, Subcutaneous, TID WC, Moody Alto, MD   insulin  aspart (novoLOG ) injection 0-5 Units, 0-5 Units, Subcutaneous, QHS, Goel, Hersh, MD   labetalol  (NORMODYNE ) injection 20 mg, 20 mg, Intravenous, Q2H PRN, Goel, Hersh, MD   LORazepam  (ATIVAN ) injection 1 mg, 1 mg, Intravenous, Once PRN, Goel, Hersh, MD   polyethylene glycol (MIRALAX  / GLYCOLAX ) packet 17 g, 17 g, Oral, Daily PRN, Goel, Hersh, MD   sodium chloride  flush (NS) 0.9 % injection 3 mL, 3 mL, Intravenous, Q12H, Moody Alto, MD, 3 mL at 04/20/23 2228  Vitals   Vitals:   04/20/23 1930 04/20/23 2201 04/20/23 2206 04/21/23 0025  BP: (!) 143/101 (!) 120/100  131/89  Pulse: 95 100  88  Resp: 16 (!) 22  19  Temp:  97.6 F (36.4 C)  98.7 F (37.1 C)  TempSrc:  Oral  Oral  SpO2: 95% 99%  99%  Weight:      Height:   5' 8 (1.727 m)     Body mass index is 33.15 kg/m.  Physical Exam   Constitutional: Appears well-developed and well-nourished. Psych: Affect appropriate to situation, pleasant and cooperative Eyes: No scleral injection.  HENT: No OP obstruction.  Head: Normocephalic.  Cardiovascular: Normal rate and regular rhythm.  Respiratory: Quickly short of breath with exertion.  Multiple episodes of witnessed apnea while sleeping before I woke him for evaluation GI: Soft.  No distension. There is no tenderness.  Skin: Healing lesions on the right shin.  Mild edema of the bilateral lower extremities  Neurologic Examination   Neuro: Mental Status: Patient is awake, alert, oriented to person, place, month, year, and situation. Patient is able to give a clear and coherent history. No signs of aphasia or neglect Cranial Nerves: II: Visual Fields are full. Pupils are equal, round, and reactive to light.   III,IV, VI: EOMI without ptosis -- he reports some mild  diplopia on left gaze only V: Facial sensation is symmetric to light touch VII: Facial movement is symmetric.  VIII: hearing is intact to voice X: Uvula elevates symmetrically XI: Shoulder shrug is symmetric. XII: tongue is midline without atrophy or fasciculations.  Motor: Tone is normal, no cogwheeling or rigidity. Bulk is normal. 5/5 strength was present in all four extremities.  Slowed rapid finger movements of the right compared to the left. Sensory: Sensation is  symmetric to light touch and temperature in the arms and legs. Deep Tendon Reflexes: 2+ and symmetric in the biceps and patellae.  Cerebellar: FNF and HKS are intact bilaterally Gait: Slightly wide-based and shuffling with short steps and multiple steps to turn.  Able to rise on heels and toes.  Unable to tandem    Labs/Imaging/Neurodiagnostic studies   CBC:  Recent Labs  Lab May 07, 2023 1938 04/21/23 0124  WBC 4.4 4.8  NEUTROABS  --  1.9  HGB 14.8 14.9  HCT 45.3 45.0  MCV 91.3 89.8  PLT 183 200   Basic Metabolic Panel:  Lab Results  Component Value Date   NA 140 04/21/2023   K 4.1 04/21/2023   CO2 27 04/21/2023   GLUCOSE 114 (H) 04/21/2023   BUN 11 04/21/2023   CREATININE 1.26 (H) 04/21/2023   CALCIUM  9.0 04/21/2023   GFRNONAA 58 (L) 04/21/2023   GFRAA  05/16/2007    >60        The eGFR has been calculated using the MDRD equation. This calculation has not been validated in all clinical   Lipid Panel:  Lab Results  Component Value Date   LDLCALC 119 (H) 04/21/2023   HgbA1c:  Lab Results  Component Value Date   HGBA1C 5.6 05/07/23   Urine Drug Screen:     Component Value Date/Time   LABOPIA POSITIVE (A) 05/16/2007 1404   COCAINSCRNUR NONE DETECTED 05/16/2007 1404   LABBENZ NONE DETECTED 05/16/2007 1404   AMPHETMU NONE DETECTED 05/16/2007 1404   THCU NONE DETECTED 05/16/2007 1404   LABBARB  05/16/2007 1404    NONE DETECTED        DRUG SCREEN FOR MEDICAL PURPOSES ONLY.  IF  CONFIRMATION IS NEEDED FOR ANY PURPOSE, NOTIFY LAB WITHIN 5 DAYS.    INR  Lab Results  Component Value Date   INR 1.2 04/21/2023   APTT  Lab Results  Component Value Date   APTT 28 04/21/2023   CT Chest: 1. Small right greater than left pleural effusions with passive atelectasis in the lower lobes. 2. Cardiomegaly. Coronary vascular calcifications. 3. Borderline mediastinal lymph nodes, nonspecific. 4. Bilateral adrenal adenomas for which no imaging follow-up is recommended. 5. Aortic atherosclerosis.  CT angio Head and Neck with contrast(Personally reviewed): 1. Evaluation is limited by bolus timing, despite multiple attempts to time the contrast. Given the slow is with which the contrast passed from the pulmonary arteries to the aorta, a cardiac etiology is suspected. 2. The intracranial and extracranial vertebral arteries are not sufficiently opacified for adequate evaluation. The PCAs are primarily supplied by the posterior communicating arteries. Opacification of the distal basilar artery may be retrograde. 3. Moderate stenosis in the distal left M1. Mild stenosis right cavernous and supraclinoid ICA. 4. Severe stenosis in the mid right P2 and proximal left P2. 5. 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  MRI Brain and Cervical spine (Personally reviewed): 1. Acute and subacute infarcts in the posterior left frontal cortex, which includes the motor strip. [On my review infarct appears to be all of the same age] 2. Loss of the left vertebral artery flow void in the V2, V3, and V4 segments, which is concerning for occlusion or slow flow. This could be further evaluated with a CTA of the head and neck. 3. C3-C4 moderate right neural foraminal narrowing. 4. C6-C7 mild left neural foraminal narrowing. 5. No spinal canal stenosis.    ASSESSMENT   Maury Groninger is a 78  y.o. male with a past medical history significant for  diabetes, hypertension, hyperlipidemia, BMI 33.15, former smoking (remote, 4-pack-year history), currently untreated obstructive sleep apnea  He is presenting with a subacute stroke, embolic in appearance and new pleural effusion  He reports he is willing to resume using his CPAP again  Unclear if the more subacute symptoms he describes are related to something benign such as post-COVID anosmia leading to loss of appetite and loss of weight, appreciate primary team efforts to investigate potential underlying malignancy.  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  Continue outpatient follow-up with neurology for this  RECOMMENDATIONS   # Embolic left motor strip stroke - A1c meeting goal at 5.6% - LDL above goal at 119, escalate atorvastatin  to 40 mg nightly - Frequent neuro checks - Echocardiogram, pending - Lower extremity duplex pending - Continue home aspirin  81 mg daily - Plavix  300 mg load with 75 mg daily, course to be determined by stroke team - CPAP ordered while inpatient and patient will resume using at home - Risk factor modification - Telemetry monitoring; consider Zio patch versus loop recorder on discharge based on ECHO results, if no other indication for anticoagulation found  - Blood pressure goal   - Normotension, out of the window for permissive hypertension - PT consult, OT consult, Speech consult, unless patient is back to baseline - Appreciate management of pleural effusion per primary team, as well as management of other comorbidities including leukopenia - Stroke team to follow  ______________________________________________________________________   Lola Jernigan MD-PhD [facility] [phone] Available 7 PM to 7 AM, outside of these hours please call Neurologist on call as listed on  Amion.

## 2023-04-21 NOTE — Plan of Care (Signed)

## 2023-04-21 NOTE — Evaluation (Signed)
 Occupational Therapy Evaluation Patient Details Name: Kevin Flynn MRN: [id number] DOB: Apr 11, 1946 Today's Date: 04/21/2023   History of Present Illness 78 y.o. male presented to ED 1/9 with complaints of right sided weakness UE>LE that onset a week ago. Pt with RR 30 awaiting COVID test results. 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 EFY:ipjazuzd mellitus, hypertension.   Clinical Impression   Kevin Flynn was evaluated s/p the above admission list. He is indep and lives with wife at baseline. Upon evaluation the pt was limited by RUE tremor, impaired RUE proprioception, dexterity, FM and strength, intermittent diplopia occurred in L environment but resolved, BP stable, SOB with activity and awareness of CVA signs and symptoms. Overall he transferred and mobilized without physical assist or DME, noted decreased arm swing of RUE. Due to the deficits listed below the pt also needs supervision A for most ADLs and set up A required for all FM tasks in grooming and feeding. Pt will benefit from continued acute OT services and OP neuro OT.        If plan is discharge home, recommend the following: Assist for transportation;Assistance with cooking/housework    Functional Status Assessment  Patient has had a recent decline in their functional status and demonstrates the ability to make significant improvements in function in a reasonable and predictable amount of time.  Equipment Recommendations  None recommended by OT       Precautions / Restrictions Precautions Precautions: Fall Precaution Comments: intermittent diplopia Restrictions Weight Bearing Restrictions Per Provider Order: No      Mobility Bed Mobility Overal bed mobility: Needs Assistance Bed Mobility: Supine to Sit, Sit to Supine     Supine to sit: Supervision Sit to supine: Supervision    General bed mobility comments: reported dizziness and DV upon sitting initially. completed transfer again, no symptoms the 2nd time    Transfers Overall transfer level: Needs assistance Equipment used: None Transfers: Sit to/from Stand Sit to Stand: Contact guard assist           General transfer comment: for safety      Balance Overall balance assessment: Needs assistance Sitting-balance support: Feet supported Sitting balance-Leahy Scale: Fair     Standing balance support: No upper extremity supported, During functional activity Standing balance-Leahy Scale: Fair                             ADL either performed or assessed with clinical judgement   ADL Overall ADL's : Needs assistance/impaired Eating/Feeding: Set up;Sitting   Grooming: Set up;Standing   Upper Body Bathing: Set up;Sitting   Lower Body Bathing: Supervison/ safety;Sit to/from stand   Upper Body Dressing : Set up;Sitting   Lower Body Dressing: Contact guard assist;Sit to/from stand   Toilet Transfer: Supervision/safety;Ambulation   Toileting- Clothing Manipulation and Hygiene: Supervision/safety;Sit to/from stand       Functional mobility during ADLs: Supervision/safety General ADL Comments: assist needed for FM tasks due to RUE tremor and impaired coordination     Vision Baseline Vision/History: 0 No visual deficits Vision Assessment?: Vision impaired- to be further tested in functional context Additional Comments: intermittent diplopia in the L environment, resolves with one eye closed     Perception Perception: Not tested       Praxis Praxis: Not tested  Pertinent Vitals/Pain Pain Assessment Pain Assessment: No/denies pain     Extremity/Trunk Assessment Upper Extremity Assessment Upper Extremity Assessment: Right hand dominant;RUE deficits/detail RUE Deficits / Details: globally 4/5 MMT. ROM is WFL. tremor with active movement, worse with FM tasks. Poor fm and  dexterity, better control for gross motor task. decreased arm swing during mobility. impiared proprioception. able to hold cup and brush teeth with RUE, needed assist to get cap back onto paste RUE Sensation: WNL RUE Coordination: decreased fine motor   Lower Extremity Assessment Lower Extremity Assessment: Defer to PT evaluation   Cervical / Trunk Assessment Cervical / Trunk Assessment: Normal   Communication Communication Communication: No apparent difficulties   Cognition Arousal: Alert Behavior During Therapy: WFL for tasks assessed/performed Overall Cognitive Status: Within Functional Limits for tasks assessed               General Comments  VSS, orthostatic BPs assessed but negative            Home Living Family/patient expects to be discharged to:: Private residence Living Arrangements: Spouse/significant other Available Help at Discharge: Family;Available 24 hours/day Type of Home: House Home Access: Stairs to enter [employer] of Steps: 3 Entrance Stairs-Rails: Right;Left Home Layout: One level     Bathroom Shower/Tub: Producer, Television/film/video: Standard     Home Equipment: Grab bars - tub/shower;None   Additional Comments: lives with wife      Prior Functioning/Environment Prior Level of Function : Independent/Modified Independent;Driving             Mobility Comments: indep ADLs Comments: indep ADL/IADLs        OT Problem List: Decreased activity tolerance;Impaired balance (sitting and/or standing);Impaired UE functional use      OT Treatment/Interventions: Self-care/ADL training;Therapeutic exercise;Neuromuscular education;DME and/or AE instruction;Therapeutic activities;Patient/family education;Balance training    OT Goals(Current goals can be found in the care plan section) Acute Rehab OT Goals Patient Stated Goal: home OT Goal Formulation: With patient Time For Goal Achievement: 05/05/23 Potential to Achieve  Goals: Good ADL Goals Additional ADL Goal #1: Pt will complete all ADLs independently Additional ADL Goal #2: pt will indep complete RUE FM HEP to increase functional use of RUE  OT Frequency: Min 1X/week    Co-evaluation PT/OT/SLP Co-Evaluation/Treatment: Yes Reason for Co-Treatment: Complexity of the patient's impairments (multi-system involvement)   OT goals addressed during session: ADL's and self-care      AM-PAC OT 6 Clicks Daily Activity     Outcome Measure Help from another person eating meals?: None Help from another person taking care of personal grooming?: A Little Help from another person toileting, which includes using toliet, bedpan, or urinal?: A Little Help from another person bathing (including washing, rinsing, drying)?: A Little Help from another person to put on and taking off regular upper body clothing?: A Little Help from another person to put on and taking off regular lower body clothing?: A Little 6 Click Score: 19   End of Session Equipment Utilized During Treatment: Gait belt Nurse Communication: Mobility status  Activity Tolerance: Patient tolerated treatment well Patient left: in chair;with chair alarm set  OT Visit Diagnosis: Unsteadiness on feet (R26.81);Hemiplegia and hemiparesis Hemiplegia - Right/Left: Right Hemiplegia - dominant/non-dominant: Dominant Hemiplegia - caused by: Cerebral infarction                Time: 8841-8773 OT Time Calculation (min): 28 min Charges:  OT General Charges $OT Visit: 1 Visit OT Evaluation $OT Eval Moderate Complexity: 1  Mod  Lucie Kendall, OTR/L Acute Rehabilitation Services Office [phone] Secure Chat Communication Preferred   Lucie JONETTA Kendall 04/21/2023, 12:46 PM

## 2023-04-21 NOTE — Progress Notes (Signed)
 PHARMACY - ANTICOAGULATION CONSULT NOTE  Pharmacy Consult for apixaban  Indication: atrial fibrillation  Allergies  Allergen Reactions   Primidone Other (See Comments)    Drowsy     Patient Measurements: Height: 5' 8 (172.7 cm) Weight: 97.1 kg (214 lb 1.1 oz) IBW/kg (Calculated) : 68.4  Vital Signs: Temp: 97.7 F (36.5 C) (01/10 1504) Temp Source: Oral (01/10 1504) BP: 138/100 (01/10 1504) Pulse Rate: 95 (01/10 1504)  Labs: Recent Labs    04/20/23 1136 04/20/23 1557 04/20/23 1756 04/20/23 1938 04/21/23 0124  HGB 14.7  --   --  14.8 14.9  HCT 44.3  --   --  45.3 45.0  PLT 188  --   --  183 200  APTT  --   --   --   --  28  LABPROT  --   --   --   --  15.5*  INR  --   --   --   --  1.2  CREATININE 1.18  --  1.00  --  1.26*  TROPONINIHS  --  18* 19*  --   --     Estimated Creatinine Clearance: 54.6 mL/min (A) (by C-G formula based on SCr of 1.26 mg/dL (H)).   Medical History: Past Medical History:  Diagnosis Date   Depression    Diabetes mellitus without complication (HCC)    Hypertension     Medications:  Scheduled:   apixaban   5 mg Oral BID   atorvastatin   40 mg Oral QHS   dapagliflozin  propanediol  10 mg Oral Daily   feeding supplement  237 mL Oral BID BM   furosemide   20 mg Intravenous Once   insulin  aspart  0-15 Units Subcutaneous TID WC   insulin  aspart  0-5 Units Subcutaneous QHS   losartan   25 mg Oral Daily   metoprolol  succinate  25 mg Oral Daily   sodium chloride  flush  3 mL Intravenous Q12H    Assessment: 78 yo male with acute and subacute infarcts likely embolic 2/2 low EF and new Afib. Pharmacy consulted to dose Eliquis . Was receiving enoxaparin  40mg  daily for VTE ppx - ok to start Eliquis  today per neurology.   Goal of Therapy:  Monitor platelets by anticoagulation protocol: Yes   Plan:  Apixaban  5mg  PO BID to start this evening (Scr < 1.5, age < 80, weight > 60kg)  Prudence Dollar, PharmD, BCPS 04/21/2023 3:58 PM

## 2023-04-21 NOTE — Evaluation (Signed)
 Physical Therapy Evaluation Patient Details Name: Kevin Flynn MRN: [id number] DOB: 1945/08/16 Today's Date: 04/21/2023  History of Present Illness  78 y.o. male presented to ED 1/9 with complaints of right sided weakness UE>LE that onset a week ago. Pt with RR 30 awaiting COVID test results. 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 EFY:ipjazuzd mellitus, hypertension.  Clinical Impression  PTA pt living with wife in single story home with steps to enter. Pt reports independence with mobility, ADLs and iADLs. Pt limited in safe mobility by transient stroke like symptoms including double vision and decreased strength and feeling in R UE. Pt noted no R LE deficits today. Pt is supervision for bed mobility and contact guard for safety with transfers and ambulation. PT not recommending any outpatient PT at this time, but highly endorse OP Neuro OT. PT will continue to see acutely.        If plan is discharge home, recommend the following: A little help with walking and/or transfers;A little help with bathing/dressing/bathroom;Assist for transportation;Help with stairs or ramp for entrance   Can travel by private vehicle    Yes    Equipment Recommendations None recommended by PT     Functional Status Assessment Patient has had a recent decline in their functional status and demonstrates the ability to make significant improvements in function in a reasonable and predictable amount of time.     Precautions / Restrictions Precautions Precautions: Fall Precaution Comments: intermittent diplopia Restrictions Weight Bearing Restrictions Per Provider Order: No      Mobility  Bed Mobility Overal bed mobility: Needs Assistance Bed Mobility: Supine to Sit, Sit to Supine     Supine to sit: Supervision Sit to supine: Supervision   General bed mobility  comments: reported dizziness and DV upon sitting initially. completed transfer again, no symptoms the 2nd time    Transfers Overall transfer level: Needs assistance Equipment used: None Transfers: Sit to/from Stand Sit to Stand: Contact guard assist           General transfer comment: for safety    Ambulation/Gait Ambulation/Gait assistance: Contact guard assist Gait Distance (Feet): 150 Feet Assistive device: None Gait Pattern/deviations: Step-through pattern Gait velocity: WFL Gait velocity interpretation: 1.31 - 2.62 ft/sec, indicative of limited community ambulator   General Gait Details: overall generally steady gait however noted to have decreased R arm swing        Balance Overall balance assessment: Needs assistance Sitting-balance support: Feet supported Sitting balance-Leahy Scale: Fair     Standing balance support: No upper extremity supported, During functional activity Standing balance-Leahy Scale: Fair                               Pertinent Vitals/Pain Pain Assessment Pain Assessment: No/denies pain    Home Living Family/patient expects to be discharged to:: Private residence Living Arrangements: Spouse/significant other Available Help at Discharge: Family;Available 24 hours/day Type of Home: House Home Access: Stairs to enter Entrance Stairs-Rails: Doctor, General Practice of Steps: 3   Home Layout: One level Home Equipment: Grab bars - tub/shower;None Additional Comments: lives with wife    Prior Function Prior Level of Function : Independent/Modified Independent;Driving             Mobility Comments: indep ADLs Comments: indep ADL/IADLs  Extremity/Trunk Assessment   Upper Extremity Assessment Upper Extremity Assessment: Defer to OT evaluation RUE Deficits / Details: globally 4/5 MMT. ROM is WFL. tremor with active movement, worse with FM tasks. Poor fm and dexterity, better control for gross motor task.  decreased arm swing during mobility. impiared proprioception. able to hold cup and brush teeth with RUE, needed assist to get cap back onto paste RUE Sensation: WNL RUE Coordination: decreased fine motor    Lower Extremity Assessment Lower Extremity Assessment: Generalized weakness    Cervical / Trunk Assessment Cervical / Trunk Assessment: Normal  Communication   Communication Communication: No apparent difficulties  Cognition Arousal: Alert Behavior During Therapy: WFL for tasks assessed/performed Overall Cognitive Status: Within Functional Limits for tasks assessed                                          General Comments General comments (skin integrity, edema, etc.): VSS, orthostatic BPs assessed but negative, 2/4 DoE but denies any shortness of breath        Assessment/Plan    PT Assessment Patient needs continued PT services  PT Problem List Decreased activity tolerance;Decreased range of motion;Decreased safety awareness;Decreased cognition       PT Treatment Interventions DME instruction;Gait training;Stair training;Functional mobility training;Therapeutic activities;Therapeutic exercise;Balance training;Cognitive remediation;Patient/family education    PT Goals (Current goals can be found in the Care Plan section)  Acute Rehab PT Goals PT Goal Formulation: With patient/family Time For Goal Achievement: 05/05/23 Potential to Achieve Goals: Good    Frequency Min 1X/week     Co-evaluation PT/OT/SLP Co-Evaluation/Treatment: Yes Reason for Co-Treatment: Complexity of the patient's impairments (multi-system involvement)   OT goals addressed during session: ADL's and self-care       AM-PAC PT 6 Clicks Mobility  Outcome Measure Help needed turning from your back to your side while in a flat bed without using bedrails?: None Help needed moving from lying on your back to sitting on the side of a flat bed without using bedrails?: None Help  needed moving to and from a bed to a chair (including a wheelchair)?: A Little Help needed standing up from a chair using your arms (e.g., wheelchair or bedside chair)?: A Little Help needed to walk in hospital room?: A Little Help needed climbing 3-5 steps with a railing? : A Little 6 Click Score: 20    End of Session Equipment Utilized During Treatment: Gait belt Activity Tolerance: Patient tolerated treatment well Patient left: in chair;with call bell/phone within reach Nurse Communication: Mobility status PT Visit Diagnosis: Other abnormalities of gait and mobility (R26.89);Difficulty in walking, not elsewhere classified (R26.2)    Time: 8840-8773 PT Time Calculation (min) (ACUTE ONLY): 27 min   Charges:   PT Evaluation $PT Eval Moderate Complexity: 1 Mod   PT General Charges $$ ACUTE PT VISIT: 1 Visit         Alexa Golebiewski B. Fleeta Lapidus PT, DPT Acute Rehabilitation Services Please use secure chat or  Call Office [phone]   Almarie KATHEE Fleeta Fleet 04/21/2023, 1:34 PM

## 2023-04-22 ENCOUNTER — Other Ambulatory Visit (HOSPITAL_COMMUNITY): Payer: Self-pay

## 2023-04-22 DIAGNOSIS — I5020 Unspecified systolic (congestive) heart failure: Secondary | ICD-10-CM

## 2023-04-22 LAB — CBC
HCT: 44 % (ref 39.0–52.0)
Hemoglobin: 14.6 g/dL (ref 13.0–17.0)
MCH: 29.8 pg (ref 26.0–34.0)
MCHC: 33.2 g/dL (ref 30.0–36.0)
MCV: 89.8 fL (ref 80.0–100.0)
Platelets: 201 10*3/uL (ref 150–400)
RBC: 4.9 MIL/uL (ref 4.22–5.81)
RDW: 15.4 % (ref 11.5–15.5)
WBC: 4.3 10*3/uL (ref 4.0–10.5)
nRBC: 0 % (ref 0.0–0.2)

## 2023-04-22 LAB — COMPREHENSIVE METABOLIC PANEL
ALT: 14 U/L (ref 0–44)
AST: 18 U/L (ref 15–41)
Albumin: 3.1 g/dL — ABNORMAL LOW (ref 3.5–5.0)
Alkaline Phosphatase: 54 U/L (ref 38–126)
Anion gap: 8 (ref 5–15)
BUN: 15 mg/dL (ref 8–23)
CO2: 27 mmol/L (ref 22–32)
Calcium: 9 mg/dL (ref 8.9–10.3)
Chloride: 103 mmol/L (ref 98–111)
Creatinine, Ser: 1.33 mg/dL — ABNORMAL HIGH (ref 0.61–1.24)
GFR, Estimated: 55 mL/min — ABNORMAL LOW (ref >60)
Glucose, Bld: 97 mg/dL (ref 70–99)
Potassium: 4 mmol/L (ref 3.5–5.1)
Sodium: 138 mmol/L (ref 135–145)
Total Bilirubin: 0.9 mg/dL (ref 0.0–1.2)
Total Protein: 6.1 g/dL — ABNORMAL LOW (ref 6.5–8.1)

## 2023-04-22 LAB — GLUCOSE, CAPILLARY
Glucose-Capillary: 95 mg/dL (ref 70–99)
Glucose-Capillary: 128 mg/dL — ABNORMAL HIGH (ref 70–99)

## 2023-04-22 MED ORDER — APIXABAN 5 MG PO TABS
5.0000 mg | ORAL_TABLET | Freq: Two times a day (BID) | ORAL | 0 refills | Status: DC
  Filled 2023-04-22: qty 60, 30d supply, fill #0

## 2023-04-22 MED ORDER — FUROSEMIDE 40 MG PO TABS
40.0000 mg | ORAL_TABLET | ORAL | 0 refills | Status: DC
  Filled 2023-04-22: qty 14, 98d supply, fill #0

## 2023-04-22 MED ORDER — METOPROLOL SUCCINATE ER 25 MG PO TB24
25.0000 mg | ORAL_TABLET | Freq: Every day | ORAL | 0 refills | Status: DC
  Filled 2023-04-22: qty 30, 30d supply, fill #0

## 2023-04-22 MED ORDER — LOSARTAN POTASSIUM 25 MG PO TABS
25.0000 mg | ORAL_TABLET | Freq: Every day | ORAL | 0 refills | Status: DC
  Filled 2023-04-22: qty 30, 30d supply, fill #0

## 2023-04-22 MED ORDER — POTASSIUM CHLORIDE CRYS ER 20 MEQ PO TBCR
40.0000 meq | EXTENDED_RELEASE_TABLET | ORAL | 0 refills | Status: DC
  Filled 2023-04-22: qty 28, 98d supply, fill #0

## 2023-04-22 MED ORDER — DAPAGLIFLOZIN PROPANEDIOL 10 MG PO TABS
10.0000 mg | ORAL_TABLET | Freq: Every day | ORAL | 0 refills | Status: AC
  Filled 2023-04-22: qty 30, 30d supply, fill #0

## 2023-04-22 MED ORDER — AMOXICILLIN-POT CLAVULANATE 875-125 MG PO TABS
1.0000 | ORAL_TABLET | Freq: Two times a day (BID) | ORAL | 0 refills | Status: AC
  Filled 2023-04-22: qty 8, 4d supply, fill #0

## 2023-04-22 MED ORDER — ATORVASTATIN CALCIUM 40 MG PO TABS
40.0000 mg | ORAL_TABLET | Freq: Every day | ORAL | 0 refills | Status: DC
  Filled 2023-04-22: qty 30, 30d supply, fill #0

## 2023-04-22 NOTE — Plan of Care (Signed)
  Problem: Education: Goal: Knowledge of General Education information will improve Description: Including pain rating scale, medication(s)/side effects and non-pharmacologic comfort measures Outcome: Progressing   Problem: Health Behavior/Discharge Planning: Goal: Ability to manage health-related needs will improve Outcome: Progressing   Problem: Clinical Measurements: Goal: Ability to maintain clinical measurements within normal limits will improve Outcome: Progressing Goal: Will remain free from infection Outcome: Progressing Goal: Diagnostic test results will improve Outcome: Progressing Goal: Respiratory complications will improve Outcome: Progressing Goal: Cardiovascular complication will be avoided Outcome: Progressing   Problem: Activity: Goal: Risk for activity intolerance will decrease Outcome: Progressing   Problem: Nutrition: Goal: Adequate nutrition will be maintained Outcome: Progressing   Problem: Coping: Goal: Level of anxiety will decrease Outcome: Progressing   Problem: Elimination: Goal: Will not experience complications related to bowel motility Outcome: Progressing Goal: Will not experience complications related to urinary retention Outcome: Progressing   Problem: Pain Management: Goal: General experience of comfort will improve Outcome: Progressing

## 2023-04-22 NOTE — Progress Notes (Signed)
 Patient and wife verbalized understanding of dc instructions.all belongings and dc papers given to patient

## 2023-04-22 NOTE — Discharge Summary (Signed)
 Physician Discharge Summary   Patient: Kevin Flynn MRN: [id number] DOB: [birth year]  Admit date:     04/20/2023  Discharge date: 04/22/23  Discharge Physician: Kevin Flynn   PCP: [facility]   Recommendations at discharge:    Follow up with PCP in 1-2 weeks Follow up with primary neurologist as scheduled Follow up with Cardiology as scheduled  Discharge Diagnoses: Principal Problem:   Stroke (cerebrum) (HCC) Active Problems:   Leukopenia   Pleural effusion   Cardiomyopathy (HCC)   HFrEF (heart failure with reduced ejection fraction) (HCC)   Diabetes mellitus (HCC)   Former smoker   Sleep apnea in adult   Mixed hyperlipidemia   Acute CVA (cerebrovascular accident) (HCC)  Resolved Problems:   * No resolved hospital problems. *  Hospital Course: 78 y.o. male with medical history significant of diabetes mellitus, hypertension.  Patient was in his usual state of health till approximately 1 week ago when he reports new onset of right sided arm numbness sensation.  He had a little bit of a similar sensation in the right lower extremity as well.  Patient does not report any trauma neck pain and does not report any weakness.  Patient had an outpatient CAT scan done which was negative approximately a week ago.  However given the persistent symptoms patient presented to [facility] ER this afternoon.  An MRI here, noted below is finding acute CVA.  Case has been discussed with neurology who are requesting transfer to [facility] for further evaluation and workup.    Assessment and Plan: Stroke (cerebrum) (HCC) Symptoms started approximately a week ago.  Now finding of acute and subacute infarcts in the posterior left frontal cortex which includes the motor strip -Neurology following. Stroke felt to be cardioembolic. Neuro recs for initiating eliquis    Diabetes -glycemic trends stable -Continued on SSI as needed while in hospital -farxiga  started per Cardiology    Systolic CHF, new diagnosis -New EF of 25% -Appreciate Cardiology input. Ultimately would benefit from heart cath, likely would be done outpt -GDMT to be initiated here. Started faxiga 10mg , losartan  25mg  toprol  xl 25mg  -discussed with Cardiology on call who recommended weekly lasix  40mg  with KCl given concurrently   Pleural effusion This is incidentally noted after patient was noted to have tachypnea.   -suspect secondary to above CHF   Leukopenia resolved Repeat WBC is normal Resolved   OSA -cont with CPAP as tolerated -Pt admits to noncompliance at home    Consultants: Neurology, Cardiology Procedures performed:   Disposition: Home Diet recommendation:  Cardiac and Carb modified diet DISCHARGE MEDICATION: Allergies as of 04/22/2023       Reactions   Primidone Other (See Comments)   Drowsy         Medication List     STOP taking these medications    aspirin  81 MG chewable tablet       TAKE these medications    amoxicillin -clavulanate 875-125 MG tablet Commonly known as: AUGMENTIN  Take 1 tablet by mouth 2 (two) times daily for 4 days.   apixaban  5 MG Tabs tablet Commonly known as: ELIQUIS  Take 1 tablet (5 mg total) by mouth 2 (two) times daily.   atorvastatin  40 MG tablet Commonly known as: LIPITOR Take 1 tablet (40 mg total) by mouth at bedtime. What changed:  medication strength how much to take when to take this   dapagliflozin  propanediol 10 MG Tabs tablet Commonly known as: FARXIGA  Take 1 tablet (10 mg total) by mouth  daily. Start taking on: April 23, 2023   furosemide  40 MG tablet Commonly known as: Lasix  Take 1 tablet (40 mg total) by mouth once a week.   insulin  glargine-yfgn 100 UNIT/ML injection Commonly known as: SEMGLEE Inject 17 Units into the skin at bedtime.   losartan  25 MG tablet Commonly known as: COZAAR  Take 1 tablet (25 mg total) by mouth daily. Start taking on: April 23, 2023 What changed:  medication  strength how much to take   metoprolol  succinate 25 MG 24 hr tablet Commonly known as: TOPROL -XL Take 1 tablet (25 mg total) by mouth daily. Start taking on: April 23, 2023   mirabegron ER 25 MG Tb24 tablet Commonly known as: MYRBETRIQ Take 25 mg by mouth daily.   potassium chloride  SA 20 MEQ tablet Commonly known as: KLOR-CON  M Take 2 tablets (40 mEq total) by mouth once a week.        Follow-up Information     Kevin Orren SAILOR, PA-C Follow up.   Specialty: Cardiology Why: Monday May 22, 2023 Arrive by 10:40 AMAppt at 10:55 AM (25 min) Contact information: [address] [place] [address] [phone]         [facility] [place] Schedule an appointment as soon as possible for a visit in 1 month(s).          [facility] Follow up in 2 week(s).   Specialty: General Practice Why: Hospital follow up Contact information: [address] [place] [address] [phone]                Discharge Exam: Kevin Flynn   04/20/23 1102 04/20/23 1842 04/21/23 0600  Weight: 98.9 kg 98.9 kg 97.1 kg   General exam: Awake, laying in bed, in nad Respiratory system: Normal respiratory effort, no wheezing Cardiovascular system: regular rate, s1, s2 Gastrointestinal system: Soft, nondistended, positive BS Central nervous system: CN2-12 grossly intact, strength intact Extremities: Perfused, no clubbing Skin: Normal skin turgor, no notable skin lesions seen Psychiatry: Mood normal // no visual hallucinations   Condition at discharge: fair  The results of significant diagnostics from this hospitalization (including imaging, microbiology, ancillary and laboratory) are listed below for reference.   Imaging Studies: VAS US  LOWER EXTREMITY VENOUS (DVT) Result Date: 04/21/2023  Lower Venous DVT Study Patient Name:  Kevin Flynn  Date of Exam:   04/21/2023 Medical Rec #: [id number]      Accession #:    [id number] Date of Birth: [birth year]        Patient Gender: M Patient Age:   30 years Exam Location:  [facility] Procedure:      VAS US  LOWER EXTREMITY VENOUS (DVT) Referring Phys: [facility] GOEL --------------------------------------------------------------------------------  Indications: Swelling, Edema, and Numbness and weakness in right lower extremity.  Comparison Study: Previous study of left lower extremity on 4.30.2009. Performing Technologist: Edilia Elden Appl  Examination Guidelines: A complete evaluation includes B-mode imaging, spectral Doppler, color Doppler, and power Doppler as needed of all accessible portions of each vessel. Bilateral testing is considered an integral part of a complete examination. Limited examinations for reoccurring indications may be performed as noted. The reflux portion of the exam is performed with the patient in reverse Trendelenburg.  +---------+---------------+---------+-----------+----------+--------------+ RIGHT    CompressibilityPhasicitySpontaneityPropertiesThrombus Aging +---------+---------------+---------+-----------+----------+--------------+ CFV      Full           Yes      Yes                                 +---------+---------------+---------+-----------+----------+--------------+  SFJ      Full           Yes      Yes                                 +---------+---------------+---------+-----------+----------+--------------+ FV Prox  Full                                                        +---------+---------------+---------+-----------+----------+--------------+ FV Mid   Full                                                        +---------+---------------+---------+-----------+----------+--------------+ FV DistalFull                                                        +---------+---------------+---------+-----------+----------+--------------+ PFV      Full                                                         +---------+---------------+---------+-----------+----------+--------------+ POP      Full           Yes      Yes                                 +---------+---------------+---------+-----------+----------+--------------+ PTV      Full                                                        +---------+---------------+---------+-----------+----------+--------------+ PERO     Full                                                        +---------+---------------+---------+-----------+----------+--------------+   +---------+---------------+---------+-----------+----------+--------------+ LEFT     CompressibilityPhasicitySpontaneityPropertiesThrombus Aging +---------+---------------+---------+-----------+----------+--------------+ CFV      Full           Yes      Yes                                 +---------+---------------+---------+-----------+----------+--------------+ SFJ      Full           Yes      Yes                                 +---------+---------------+---------+-----------+----------+--------------+  FV Prox  Full                                                        +---------+---------------+---------+-----------+----------+--------------+ FV Mid   Full                                                        +---------+---------------+---------+-----------+----------+--------------+ FV DistalFull                                                        +---------+---------------+---------+-----------+----------+--------------+ PFV      Full                                                        +---------+---------------+---------+-----------+----------+--------------+ POP      Full           Yes      Yes                                 +---------+---------------+---------+-----------+----------+--------------+ PTV      Full                                                         +---------+---------------+---------+-----------+----------+--------------+ PERO     Full                                                        +---------+---------------+---------+-----------+----------+--------------+     Summary: BILATERAL: - No evidence of deep vein thrombosis seen in the lower extremities, bilaterally. -No evidence of popliteal cyst, bilaterally.   *See table(s) above for measurements and observations. Electronically signed by Penne Colorado MD on 04/21/2023 at 7:19:32 PM.    Final    ECHOCARDIOGRAM COMPLETE Result Date: 04/21/2023    ECHOCARDIOGRAM REPORT   Patient Name:   BRYLEE MCGREAL Date of Exam: 04/21/2023 Medical Rec #:  [id number]     Height:       68.0 in Accession #:    [id number]    Weight:       214.1 lb Date of Birth:  1945/06/14      BSA:          2.104 m Patient Age:    77 years      BP:           140/96 mmHg Patient Gender: M             HR:  85 bpm. Exam Location:  Inpatient Procedure: 2D Echo, Cardiac Doppler and Color Doppler Indications:   Stroke  History:       Patient has no prior history of Echocardiogram examinations.                Stroke.  Sonographer:   Lanell Maduro Referring      [id number] [facility] GOEL Phys: IMPRESSIONS  1. Left ventricular ejection fraction, by estimation, is 25 to 30%. The left ventricle has severely decreased function. The left ventricle demonstrates regional wall motion abnormalities with mid to apical severe hypokinesis to akinesis, mid to apical anterior akinesis, akinesis of the apex and peri-apical segments. This suggests prior LAD territory infarction. No LV thrombus noted. The left ventricular internal cavity size was mildly dilated. Left ventricular diastolic parameters are consistent with Grade II diastolic dysfunction (pseudonormalization). Elevated left atrial pressure.  2. Right ventricular systolic function is mildly reduced. The right ventricular size is normal. There is severely elevated pulmonary artery systolic  pressure. The estimated right ventricular systolic pressure is 62.1 mmHg.  3. Left atrial size was mildly dilated.  4. Right atrial size was mildly dilated.  5. The mitral valve is normal in structure. Trivial mitral valve regurgitation. No evidence of mitral stenosis.  6. The aortic valve is tricuspid. There is mild calcification of the aortic valve. Aortic valve regurgitation is mild. No aortic stenosis is present.  7. Aortic dilatation noted. There is mild dilatation of the aortic root, measuring 38 mm. There is mild dilatation of the ascending aorta, measuring 42 mm.  8. The inferior vena cava is dilated in size with <50% respiratory variability, suggesting right atrial pressure of 15 mmHg. FINDINGS  Left Ventricle: Left ventricular ejection fraction, by estimation, is 25 to 30%. The left ventricle has severely decreased function. The left ventricle demonstrates regional wall motion abnormalities. The left ventricular internal cavity size was mildly  dilated. There is no left ventricular hypertrophy. Left ventricular diastolic parameters are consistent with Grade II diastolic dysfunction (pseudonormalization). Elevated left atrial pressure. Right Ventricle: The right ventricular size is normal. No increase in right ventricular wall thickness. Right ventricular systolic function is mildly reduced. There is severely elevated pulmonary artery systolic pressure. The tricuspid regurgitant velocity is 3.43 m/s, and with an assumed right atrial pressure of 15 mmHg, the estimated right ventricular systolic pressure is 62.1 mmHg. Left Atrium: Left atrial size was mildly dilated. Right Atrium: Right atrial size was mildly dilated. Pericardium: There is no evidence of pericardial effusion. Mitral Valve: The mitral valve is normal in structure. Mild mitral annular calcification. Trivial mitral valve regurgitation. No evidence of mitral valve stenosis. Tricuspid Valve: The tricuspid valve is normal in structure. Tricuspid  valve regurgitation is mild. Aortic Valve: The aortic valve is tricuspid. There is mild calcification of the aortic valve. Aortic valve regurgitation is mild. Aortic regurgitation PHT measures 490 msec. No aortic stenosis is present. Pulmonic Valve: The pulmonic valve was normal in structure. Pulmonic valve regurgitation is mild. Aorta: Aortic dilatation noted. There is mild dilatation of the aortic root, measuring 38 mm. There is mild dilatation of the ascending aorta, measuring 42 mm. Venous: The inferior vena cava is dilated in size with less than 50% respiratory variability, suggesting right atrial pressure of 15 mmHg. IAS/Shunts: No atrial level shunt detected by color flow Doppler.  LEFT VENTRICLE PLAX 2D LVIDd:         6.10 cm      Diastology LVIDs:  4.90 cm      LV e' medial:    4.66 cm/s LV PW:         1.00 cm      LV E/e' medial:  24.7 LV IVS:        0.70 cm      LV e' lateral:   6.99 cm/s LVOT diam:     2.30 cm      LV E/e' lateral: 16.5 LV SV:         42 LV SV Index:   20 LVOT Area:     4.15 cm  LV Volumes (MOD) LV vol d, MOD A2C: 166.0 ml LV vol d, MOD A4C: 178.0 ml LV vol s, MOD A2C: 105.0 ml LV vol s, MOD A4C: 124.0 ml LV SV MOD A2C:     61.0 ml LV SV MOD A4C:     178.0 ml LV SV MOD BP:      54.1 ml RIGHT VENTRICLE            IVC RV Basal diam:  3.70 cm    IVC diam: 2.60 cm RV S prime:     6.99 cm/s TAPSE (M-mode): 2.2 cm LEFT ATRIUM             Index        RIGHT ATRIUM           Index LA diam:        3.80 cm 1.81 cm/m   RA Area:     20.00 cm LA Vol (A2C):   77.1 ml 36.65 ml/m  RA Volume:   54.60 ml  25.96 ml/m LA Vol (A4C):   73.4 ml 34.89 ml/m LA Biplane Vol: 76.4 ml 36.32 ml/m  AORTIC VALVE             PULMONIC VALVE LVOT Vmax:   67.10 cm/s  PR End Diast Vel: 1.67 msec LVOT Vmean:  45.500 cm/s LVOT VTI:    0.100 m AI PHT:      490 msec  AORTA Ao Root diam: 3.80 cm Ao Asc diam:  4.20 cm MITRAL VALVE                TRICUSPID VALVE MV Area (PHT): 5.02 cm     TR Peak grad:   47.1 mmHg  MV Decel Time: 151 msec     TR Vmax:        343.00 cm/s MR Peak grad: 85.0 mmHg MR Mean grad: 56.0 mmHg     SHUNTS MR Vmax:      461.00 cm/s   Systemic VTI:  0.10 m MR Vmean:     352.0 cm/s    Systemic Diam: 2.30 cm MV E velocity: 115.00 cm/s Dalton McleanMD Electronically signed by Ezra Kanner Signature Date/Time: 04/21/2023/9:54:03 AM    Final    CT CHEST WO CONTRAST Result Date: 04/20/2023 CLINICAL DATA:  Neural effusion EXAM: CT CHEST WITHOUT CONTRAST TECHNIQUE: Multidetector CT imaging of the chest was performed following the standard protocol without IV contrast. RADIATION DOSE REDUCTION: This exam was performed according to the departmental dose-optimization program which includes automated exposure control, adjustment of the mA and/or kV according to patient size and/or use of iterative reconstruction technique. COMPARISON:  Chest x-ray 04/20/2023 FINDINGS: Cardiovascular: Limited assessment without intravenous contrast. Mild aortic atherosclerosis. Maximum ascending aortic diameter of 4.1 cm. Coronary vascular calcifications. Cardiomegaly. No sizable pericardial effusion Mediastinum/Nodes: Patent trachea. No thyroid mass. Multiple borderline mediastinal lymph nodes. Right paratracheal nodes measuring up  to 11 mm. Esophagus within normal limits Lungs/Pleura: Small right greater than left pleural effusions. Passive atelectasis in the lower lobes. Upper Abdomen: No acute finding. Right adrenal nodule measuring 3 cm with density value of -0.8. Diffusely thickened left adrenal gland. 2.1 cm left adrenal nodule with density value of 1.4. Excreted contrast within the kidneys 2.1 cm left adrenal nodule with density value of 1.4. Musculoskeletal: No acute osseous abnormality IMPRESSION: 1. Small right greater than left pleural effusions with passive atelectasis in the lower lobes. 2. Cardiomegaly. Coronary vascular calcifications. 3. Borderline mediastinal lymph nodes, nonspecific. 4. Bilateral adrenal adenomas  for which no imaging follow-up is recommended. 5. Aortic atherosclerosis. Aortic Atherosclerosis (ICD10-I70.0). Electronically Signed   By: Luke Bun M.D.   On: 04/20/2023 19:15   CT ANGIO HEAD NECK W WO CM Result Date: 04/20/2023 CLINICAL DATA:  Left MCA territory infarct on same-day MRI, loss of the left vertebral artery flow void EXAM: CT ANGIOGRAPHY HEAD AND NECK WITH AND WITHOUT CONTRAST TECHNIQUE: Multidetector CT imaging of the head and neck was performed using the standard protocol during bolus administration of intravenous contrast. Multiplanar CT image reconstructions and MIPs were obtained to evaluate the vascular anatomy. Carotid stenosis measurements (when applicable) are obtained utilizing NASCET criteria, using the distal internal carotid diameter as the denominator. RADIATION DOSE REDUCTION: This exam was performed according to the departmental dose-optimization program which includes automated exposure control, adjustment of the mA and/or kV according to patient size and/or use of iterative reconstruction technique. CONTRAST:  OMNIPAQUE  IOHEXOL  350 MG/ML SOLN COMPARISON:  No prior CT head or CTA head and neck available, correlation is made with MRI head 04/20/2023 FINDINGS: Evaluation is limited by bolus timing, despite multiple attempts to time the contrast. Given the slow is with which the contrast passed from the pulmonary arteries to the aorta, a cardiac etiology is suspected CT HEAD FINDINGS Brain: The acute and subacute infarcts seen on MRI are not appreciated on CT. No evidence of additional acute infarct, hemorrhage, mass, mass effect, or midline shift. No hydrocephalus or extra-axial fluid collection. Vascular: No hyperdense vessel. Skull: Negative for fracture or focal lesion. Sinuses/Orbits: No acute finding. Other: The mastoid air cells are well aerated. CTA NECK FINDINGS Aortic arch: The aortic arch is incompletely included in the field of view. Two-vessel arch with a common  origin of the brachiocephalic and left common carotid arteries. Imaged portion shows no evidence of aneurysm or dissection. Right carotid system: No evidence of dissection, occlusion, or hemodynamically significant stenosis (greater than 50%). Left carotid system: No evidence of dissection, occlusion, or hemodynamically significant stenosis (greater than 50%). Vertebral arteries: The vertebral arteries are not sufficiently opacified for adequate evaluation. Skeleton: No acute osseous abnormality. Degenerative changes in the cervical spine. Other neck: No acute finding. Upper chest: No focal pulmonary opacity or pleural effusion. Review of the MIP images confirms the above findings CTA HEAD FINDINGS Evaluation is limited by bolus timing, as described above, which primarily limits evaluation of the posterior circulation. Anterior circulation: Both internal carotid arteries are patent to the termini, with mild stenosis in the right cavernous and supraclinoid ICA (series 18, images 196, 2 2, and 209) Aplastic right A1. Patent left A1. 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 Anterior cerebral arteries are patent through the A2 segments, with evaluation of more distal segments limited by bolus timing. Moderate stenosis in the distal left M1 (series 18, image 211). No significant stenosis right  M1. The M2 segments and proximal M3 segments are perfused without significant stenosis. Posterior circulation: Poor visualization of the bilateral vertebral arteries, which may be due to bolus timing as well as existing stenosis. The distal basilar artery is patent but diminutive. Patent left P1. Fetal origin of the right PCA and near fetal origin of the left PCA from the posterior communicating arteries. Severe stenosis in the mid right P2 (series 18, image 206). A severe stenosis in the proximal left P2 (series 18, image 211). Evaluation past the P2 segments is limited  by bolus timing. Venous sinuses: As permitted by contrast timing, patent. Anatomic variants: Aplastic right A1. Fetal right PCA. Near fetal left PCA. No evidence of vascular malformation. Review of the MIP images confirms the above findings IMPRESSION: 1. Evaluation is limited by bolus timing, despite multiple attempts to time the contrast. Given the slow is with which the contrast passed from the pulmonary arteries to the aorta, a cardiac etiology is suspected. 2. The intracranial and extracranial vertebral arteries are not sufficiently opacified for adequate evaluation. The PCAs are primarily supplied by the posterior communicating arteries. Opacification of the distal basilar artery may be retrograde. 3. Moderate stenosis in the distal left M1. Mild stenosis right cavernous and supraclinoid ICA. 4. Severe stenosis in the mid right P2 and proximal left P2. 5. 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 Electronically Signed   By: Donald Campion M.D.   On: 04/20/2023 16:58   MR BRAIN WO CONTRAST Result Date: 04/20/2023 CLINICAL DATA:  Right-sided numbness in the arm and leg for 2 weeks, slurred speech and memory fog, right limb, worsening right hand tremor EXAM: MRI HEAD WITHOUT CONTRAST MRI CERVICAL SPINE WITHOUT CONTRAST TECHNIQUE: Multiplanar, multiecho pulse sequences of the brain and surrounding structures, and cervical spine, to include the craniocervical junction and cervicothoracic junction, were obtained without intravenous contrast. COMPARISON:  None Available. FINDINGS: MRI HEAD FINDINGS Brain: Increased signal on diffusion-weighted imaging, with ADC correlates, in the posterior left frontal cortex (series 5, images 37-44 and series 13, images 15-18), which includes the motor strip. The intensity of the diffusion restriction varies, suggesting both acute and subacute infarcts. These areas are associated with increased T2 hyperintense signal. No acute hemorrhage,  mass, mass effect, or midline shift. No hydrocephalus or extra-axial collection. Pituitary and craniocervical junction within normal limits. No hemosiderin deposition to suggest remote hemorrhage. Age related cerebral atrophy. Scattered and confluent T2 hyperintense signal in the periventricular white matter, likely the sequela of mild-to-moderate chronic small vessel ischemic disease. Prominent dilated perivascular spaces in the cortex and basal ganglia. Remote lacunar infarct in pons. Vascular: Loss of the left vertebral artery flow void (series 8, image 4). Otherwise normal arterial flow voids. Skull and upper cervical spine: Normal marrow signal. Sinuses/Orbits: Clear paranasal sinuses. No acute finding in the orbits. Other: The mastoid air cells are well aerated. MRI CERVICAL SPINE FINDINGS Alignment: Straightening of the normal cervical lordosis. No significant listhesis. Levocurvature of the proximal thoracic spine. Vertebrae: No acute fracture, evidence of discitis, or suspicious osseous lesion. Cord: Normal signal and morphology. Posterior Fossa, vertebral arteries, paraspinal tissues: Loss of the left vertebral artery flow void in the V2 and V3 segments. Otherwise negative. Disc levels: C2-C3: No significant disc bulge. No spinal canal stenosis or neuroforaminal narrowing. C3-C4: No significant disc bulge. Right greater than left facet and uncovertebral hypertrophy. No spinal canal stenosis. Moderate right neural foraminal narrowing. C4-C5: No significant disc bulge. No spinal canal  stenosis or neuroforaminal narrowing. C5-C6: No significant disc bulge. No spinal canal stenosis or neuroforaminal narrowing. C6-C7: Minimal disc bulge. Uncovertebral hypertrophy. No spinal canal stenosis. Mild left neural foraminal narrowing. C7-T1: No significant disc bulge. No spinal canal stenosis or neuroforaminal narrowing. IMPRESSION: 1. 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 2. Loss of the left vertebral artery flow void in the V2, V3, and V4 segments, which is concerning for occlusion or slow flow. This could be further evaluated with a CTA of the head and neck. 3. C3-C4 moderate right neural foraminal narrowing. 4. C6-C7 mild left neural foraminal narrowing. 5. No spinal canal stenosis. These results were called by telephone at the time of interpretation on 04/20/2023 at 3:22 pm to provider JULIE HAVILAND , who verbally acknowledged these results. Electronically Signed   By: Donald Campion M.D.   On: 04/20/2023 15:23   MR Cervical Spine Wo Contrast Result Date: 04/20/2023 CLINICAL DATA:  Right-sided numbness in the arm and leg for 2 weeks, slurred speech and memory fog, right limb, worsening right hand tremor EXAM: MRI HEAD WITHOUT CONTRAST MRI CERVICAL SPINE WITHOUT CONTRAST TECHNIQUE: Multiplanar, multiecho pulse sequences of the brain and surrounding structures, and cervical spine, to include the craniocervical junction and cervicothoracic junction, were obtained without intravenous contrast. COMPARISON:  None Available. FINDINGS: MRI HEAD FINDINGS Brain: Increased signal on diffusion-weighted imaging, with ADC correlates, in the posterior left frontal cortex (series 5, images 37-44 and series 13, images 15-18), which includes the motor strip. The intensity of the diffusion restriction varies, suggesting both acute and subacute infarcts. These areas are associated with increased T2 hyperintense signal. No acute hemorrhage, mass, mass effect, or midline shift. No hydrocephalus or extra-axial collection. Pituitary and craniocervical junction within normal limits. No hemosiderin deposition to suggest remote hemorrhage. Age related cerebral atrophy. Scattered and confluent T2 hyperintense signal in the periventricular white matter, likely the sequela of mild-to-moderate chronic small vessel ischemic disease. Prominent dilated perivascular spaces in the cortex and basal  ganglia. Remote lacunar infarct in pons. Vascular: Loss of the left vertebral artery flow void (series 8, image 4). Otherwise normal arterial flow voids. Skull and upper cervical spine: Normal marrow signal. Sinuses/Orbits: Clear paranasal sinuses. No acute finding in the orbits. Other: The mastoid air cells are well aerated. MRI CERVICAL SPINE FINDINGS Alignment: Straightening of the normal cervical lordosis. No significant listhesis. Levocurvature of the proximal thoracic spine. Vertebrae: No acute fracture, evidence of discitis, or suspicious osseous lesion. Cord: Normal signal and morphology. Posterior Fossa, vertebral arteries, paraspinal tissues: Loss of the left vertebral artery flow void in the V2 and V3 segments. Otherwise negative. Disc levels: C2-C3: No significant disc bulge. No spinal canal stenosis or neuroforaminal narrowing. C3-C4: No significant disc bulge. Right greater than left facet and uncovertebral hypertrophy. No spinal canal stenosis. Moderate right neural foraminal narrowing. C4-C5: No significant disc bulge. No spinal canal stenosis or neuroforaminal narrowing. C5-C6: No significant disc bulge. No spinal canal stenosis or neuroforaminal narrowing. C6-C7: Minimal disc bulge. Uncovertebral hypertrophy. No spinal canal stenosis. Mild left neural foraminal narrowing. C7-T1: No significant disc bulge. No spinal canal stenosis or neuroforaminal narrowing. IMPRESSION: 1. Acute and subacute infarcts in the posterior left frontal cortex, which includes the motor strip. 2. Loss of the left vertebral artery flow void in the V2, V3, and V4 segments, which is concerning for occlusion or slow flow. This could be further evaluated with a CTA of the head and neck. 3.  C3-C4 moderate right neural foraminal narrowing. 4. C6-C7 mild left neural foraminal narrowing. 5. No spinal canal stenosis. These results were called by telephone at the time of interpretation on 04/20/2023 at 3:22 pm to provider JULIE  HAVILAND , who verbally acknowledged these results. Electronically Signed   By: Donald Campion M.D.   On: 04/20/2023 15:23   DG Chest Portable 1 View Result Date: 04/20/2023 CLINICAL DATA:  Shortness of breath EXAM: PORTABLE CHEST 1 VIEW COMPARISON:  None Available. FINDINGS: Enlarged cardiopericardial silhouette with vascular congestion. Small effusions. Lung base opacities identified particularly left retrocardiac. Infiltrates possible. No pneumothorax. Films are under penetrated. Prominent degenerative changes of the spine IMPRESSION: Enlarged cardiopericardial silhouette with vascular congestion. Small effusions with some lung base opacities. Possible infiltrate. Recommend follow up Electronically Signed   By: Ranell Bring M.D.   On: 04/20/2023 14:01    Microbiology: Results for orders placed or performed during the hospital encounter of 04/20/23  Resp panel by RT-PCR (RSV, Flu A&B, Covid) Anterior Nasal Swab     Status: None   Collection Time: 04/20/23  1:43 PM   Specimen: Anterior Nasal Swab  Result Value Ref Range Status   SARS Coronavirus 2 by RT PCR NEGATIVE NEGATIVE Final    Comment: (NOTE) SARS-CoV-2 target nucleic acids are NOT DETECTED.  The SARS-CoV-2 RNA is generally detectable in upper respiratory specimens during the acute phase of infection. The lowest concentration of SARS-CoV-2 viral copies this assay can detect is 138 copies/mL. A negative result does not preclude SARS-Cov-2 infection and should not be used as the sole basis for treatment or other patient management decisions. A negative result may occur with  improper specimen collection/handling, submission of specimen other than nasopharyngeal swab, presence of viral mutation(s) within the areas targeted by this assay, and inadequate number of viral copies(<138 copies/mL). A negative result must be combined with clinical observations, patient history, and epidemiological information. The expected result is  Negative.  Fact Sheet for Patients:  bloggercourse.com  Fact Sheet for Healthcare Providers:  seriousbroker.it  This test is no t yet approved or cleared by the [place]  FDA and  has been authorized for detection and/or diagnosis of SARS-CoV-2 by FDA under an Emergency Use Authorization (EUA). This EUA will remain  in effect (meaning this test can be used) for the duration of the COVID-19 declaration under Section 564(b)(1) of the Act, 21 U.S.C.section 360bbb-3(b)(1), unless the authorization is terminated  or revoked sooner.       Influenza A by PCR NEGATIVE NEGATIVE Final   Influenza B by PCR NEGATIVE NEGATIVE Final    Comment: (NOTE) The Xpert Xpress SARS-CoV-2/FLU/RSV plus assay is intended as an aid in the diagnosis of influenza from Nasopharyngeal swab specimens and should not be used as a sole basis for treatment. Nasal washings and aspirates are unacceptable for Xpert Xpress SARS-CoV-2/FLU/RSV testing.  Fact Sheet for Patients: bloggercourse.com  Fact Sheet for Healthcare Providers: seriousbroker.it  This test is not yet approved or cleared by the [place]  FDA and has been authorized for detection and/or diagnosis of SARS-CoV-2 by FDA under an Emergency Use Authorization (EUA). This EUA will remain in effect (meaning this test can be used) for the duration of the COVID-19 declaration under Section 564(b)(1) of the Act, 21 U.S.C. section 360bbb-3(b)(1), unless the authorization is terminated or revoked.     Resp Syncytial Virus by PCR NEGATIVE NEGATIVE Final    Comment: (NOTE) Fact Sheet for Patients: bloggercourse.com  Fact Sheet for Healthcare Providers: seriousbroker.it  This test is not yet approved or cleared by the [place]  FDA and has been authorized for detection and/or diagnosis of  SARS-CoV-2 by FDA under an Emergency Use Authorization (EUA). This EUA will remain in effect (meaning this test can be used) for the duration of the COVID-19 declaration under Section 564(b)(1) of the Act, 21 U.S.C. section 360bbb-3(b)(1), unless the authorization is terminated or revoked.  Performed at [facility], [address] [place], [place] [address]   Culture, blood (Routine X 2) w Reflex to ID Panel     Status: None (Preliminary result)   Collection Time: 04/20/23  7:17 PM   Specimen: BLOOD  Result Value Ref Range Status   Specimen Description   Final    BLOOD BLOOD LEFT ARM Performed at [facility], [address], [place], [place] [address]    Special Requests   Final    BOTTLES DRAWN AEROBIC AND ANAEROBIC Blood Culture adequate volume Performed at [facility], [address], [place], [place] [address]    Culture   Final    NO GROWTH 2 DAYS Performed at [facility], [address], [place], [place] [address]    Report Status PENDING  Incomplete  Culture, blood (Routine X 2) w Reflex to ID Panel     Status: None (Preliminary result)   Collection Time: 04/21/23  1:24 AM   Specimen: BLOOD  Result Value Ref Range Status   Specimen Description BLOOD BLOOD RIGHT ARM  Final   Special Requests   Final    BOTTLES DRAWN AEROBIC AND ANAEROBIC Blood Culture results may not be optimal due to an inadequate volume of blood received in culture bottles   Culture   Final    NO GROWTH 1 DAY Performed at [facility], [address] [place], [place] [address]    Report Status PENDING  Incomplete    Labs: CBC: Recent Labs  Lab 04/20/23 1136 04/20/23 1938 04/21/23 0124 04/22/23 0622  WBC 3.9* 4.4 4.8 4.3  NEUTROABS  --   --  1.9  --   HGB 14.7 14.8 14.9 14.6  HCT 44.3 45.3 45.0 44.0  MCV 92.9 91.3 89.8 89.8  PLT 188 183 200 201   Basic Metabolic Panel: Recent Labs  Lab 04/20/23 1136  04/20/23 1756 04/21/23 0124 04/22/23 0622  NA 138  --  140 138  K 4.1  --  4.1 4.0  CL 104  --  104 103  CO2 27  --  27 27  GLUCOSE 94  --  114* 97  BUN 14  --  11 15  CREATININE 1.18 1.00 1.26* 1.33*  CALCIUM  9.0  --  9.0 9.0   Liver Function Tests: Recent Labs  Lab 04/22/23 0622  AST 18  ALT 14  ALKPHOS 54  BILITOT 0.9  PROT 6.1*  ALBUMIN 3.1*   CBG: Recent Labs  Lab 04/21/23 1145 04/21/23 1631 04/21/23 2114 04/22/23 0626 04/22/23 1054  GLUCAP 104* 115* 97 95 128*    Discharge time spent: less than 30 minutes.  Signed: Garnette Pelt, MD [facility] 04/22/2023

## 2023-04-22 NOTE — Progress Notes (Signed)
 Chart reviewed - cardiology will follow peripherally

## 2023-04-25 LAB — CULTURE, BLOOD (ROUTINE X 2)
Culture: NO GROWTH
Special Requests: ADEQUATE

## 2023-04-26 LAB — CULTURE, BLOOD (ROUTINE X 2): Culture: NO GROWTH

## 2023-05-03 ENCOUNTER — Telehealth: Payer: Self-pay

## 2023-05-03 DIAGNOSIS — I639 Cerebral infarction, unspecified: Secondary | ICD-10-CM

## 2023-05-03 NOTE — Patient Outreach (Signed)
  Emmi Stroke Care Coordination Follow Up  05/03/2023 Name:  Kevin Flynn MRN:  [id number] DOB:  1945-05-12  Subjective: Kevin Flynn is a 78 y.o. year old male who is a primary care patient of [facility] An Emmi alert was received indicating patient responded to questions: Filled new prescriptions?. I reached out by phone to follow up on the alert and spoke to Patient Caregiver. Spoke with patient who confirmed HIPAA. Patient provided permission to speak with wife.   Wife reports that she lost the bottle of Myrbetriq. Reports she has looked all over the house and can not find it.  Patient does not have a local PCP.  Wife reports this is a new medications.  I reviewed with wife that this medications helps with bladder control.  I will send a referral to pharmacy to assist with getting this medication for patient. Wife denies any other concerns today.   Care Coordination Interventions:  Yes, provided   Interventions Today    Flowsheet Row Most Recent Value  Chronic Disease   Chronic disease during today's visit Other  [CVA]  General Interventions   General Interventions Discussed/Reviewed General Interventions Discussed  Pharmacy Interventions   Pharmacy Dicussed/Reviewed Referral to Pharmacist  [wife lost bottle of Myrbetriq]       Follow up plan:  referral to pharmacy    Encounter Outcome:  Patient Visit Completed   Lonia Chimera, RN, BSN, CEN Population Health- Transition of Care Team.  [facility] [phone]

## 2023-05-03 NOTE — Progress Notes (Signed)
Received a red flag Emmi stroke notification. I have assigned a Nurse Care Coordinator to call for follow up and determine if there are any Case Management needs.    Iverson Alamin, Donivan Scull [facility] Care Management Assistant [place]/ [facility]  [phone]

## 2023-05-08 ENCOUNTER — Telehealth: Payer: Self-pay

## 2023-05-08 DIAGNOSIS — I639 Cerebral infarction, unspecified: Secondary | ICD-10-CM

## 2023-05-08 NOTE — Patient Outreach (Signed)
  [facility] Stroke Care Coordination Follow Up  05/08/2023 Name:  Kevin Flynn MRN:  [id number] DOB:  [birth year]  Subjective: Kevin Flynn is a 78 y.o. year old male who is a primary care patient of [facility] An Emmi alert was received indicating patient responded to questions: Went to follow-up appointment?. I reached out by phone to follow up on the alert and spoke to Caregiver.  Care Coordination Interventions:  Yes, provided  Reviewed and assessed concerns.  Follow up plan: No further intervention required.   Encounter Outcome:  Patient Visit Completed   Lonia Chimera, RN, BSN, CEN Population Health- Transition of Care Team.  [facility] [phone]

## 2023-05-08 NOTE — Patient Outreach (Signed)
  [facility] Stroke Care Coordination Follow Up  05/08/2023 Name:  Kevin Flynn MRN:  [id number] DOB:  1946/04/04  Subjective: Kevin Flynn is a 78 y.o. year old male who is a primary care patient of [facility] An Emmi alert was received indicating patient responded to questions: Went to follow-up appointment?. I reached out by phone to follow up on the alert and spoke to  no one. Left a message requesting a call back .  Care Coordination Interventions:  No, not indicated   Follow up plan: Follow up call scheduled for 24 hours    Encounter Outcome:  No Answer   Lonia Chimera, RN, BSN, CEN Population Health- Transition of Care Team.  [facility] [phone]

## 2023-05-21 NOTE — Progress Notes (Signed)
 Cardiology Office Note:  .   Date:  05/22/2023  ID:  Kevin Flynn, DOB 09-16-1945, MRN [id number] PCP: [facility]  [facility] Providers Cardiologist:  None {  History of Present Illness: .   Kevin Flynn is a 78 y.o. male with a past medical history of hypertension, diabetes, OSA not on CPAP, baseline tremor who is seen for evaluation 04/21/2023 of new onset HFrEF.  [facility] denies any [facility] history of coronary disease.  Previous smoker x 4-pack-year history.  Recent alcohol use however stopped recently.  Intermittent marijuana use.  He was having some motor function weakness while at a [place] as well as possible slurred speech reported by his wife and some gait issues.  On admission, he was noted to be tachypneic and incidentally found to have a pleural effusion that was felt to be parapneumonic due to possible aspiration event.  He was placed on prophylactic antibiotics.  CT confirmed small right greater than left pleural effusion with passive atelectasis.  He was also given 1 dose of IV Lasix 40 mg and reportedly had good urine output and improvement in tachypnea/SOB.  Echo was obtained that showed newly reduced EF 25 to 30% with regional wall motion abnormalities concerning for LAD infarct.  Mildly reduced RV function with elevated PASP 62.1.  No LV thrombus noted.  Cardiology was consulted for newly discovered HFrEF and cardiomyopathy.  When he was last seen 04/21/2023 he continued to have some motor defects however was doing well.  Reported for the last few months he had progressive disease and exercise intolerance of previously noted to have a brisk gait and able to ambulate without difficulty however more recently has had a difficult time and has been shuffling and more short of breath and tachypneic.  However, patient denies chest pain, orthopnea, PND, palpitations, falls, syncope, peripheral edema.  Also reported unintentional weight loss of 25+ pounds  due to decreased appetite.  Also noted to have leukopenia on admission.  CT scan was noted to have aortic atherosclerosis and coronary calcifications.  TSH was normal.  Troponin 18 and 18.  BNP 973.  Today, he presents with a history of stroke and recent hospitalization, with ongoing motor defects and new onset of tremors in the right hand. He reports a brisk gait that has transitioned to a shuffle, along with shortness of breath. He has also experienced significant weight loss of twenty-five pounds due to poor appetite. During his recent hospital stay, a CT scan revealed calcifications in the coronary arteries, and lab work indicated fluid retention. The patient was started on new medications, including diuretics, to manage these conditions. An echocardiogram revealed a low heart pump function, for which he was started on new heart medications. The patient's spouse reports that the patient's appetite has not improved significantly since discharge, and the patient's right hand tremor has worsened.  Reports no shortness of breath nor dyspnea on exertion. Reports no chest pain, pressure, or tightness. No edema, orthopnea, PND. Reports no palpitations.   Discussed the use of AI scribe software for clinical note transcription with the patient, who gave verbal consent to proceed.   ROS: Pertinent ROS in HPI  Studies Reviewed: .        Echocardiogram 04/21/2023 1. Left ventricular ejection fraction, by estimation, is 25 to 30%. The  left ventricle has severely decreased function. The left ventricle  demonstrates regional wall motion abnormalities with mid to apical severe  hypokinesis to akinesis, mid to apical  anterior akinesis, akinesis of the apex and peri-apical segments. This  suggests prior LAD territory infarction. No LV thrombus noted. The left  ventricular internal cavity size was mildly dilated. Left ventricular  diastolic parameters are consistent with  Grade II diastolic dysfunction  (pseudonormalization). Elevated left atrial  pressure.   2. Right ventricular systolic function is mildly reduced. The right  ventricular size is normal. There is severely elevated pulmonary artery  systolic pressure. The estimated right ventricular systolic pressure is  62.1 mmHg.   3. Left atrial size was mildly dilated.   4. Right atrial size was mildly dilated.   5. The mitral valve is normal in structure. Trivial mitral valve  regurgitation. No evidence of mitral stenosis.   6. The aortic valve is tricuspid. There is mild calcification of the  aortic valve. Aortic valve regurgitation is mild. No aortic stenosis is  present.   7. Aortic dilatation noted. There is mild dilatation of the aortic root,  measuring 38 mm. There is mild dilatation of the ascending aorta,  measuring 42 mm.   8. The inferior vena cava is dilated in size with <50% respiratory  variability, suggesting right atrial pressure of 15 mmHg.       Physical Exam:   VS:  BP 110/76   Pulse 83   Ht 5\' 8"  (1.727 m)   Wt 214 lb (97.1 kg)   SpO2 96%   BMI 32.54 kg/m    Wt Readings from Last 3 Encounters:  05/22/23 214 lb (97.1 kg)  04/21/23 214 lb 1.1 oz (97.1 kg)  03/02/13 240 lb (108.9 kg)    GEN: Well nourished, well developed in no acute distress NECK: No JVD; No carotid bruits CARDIAC: RRR, no murmurs, rubs, gallops RESPIRATORY:  Clear to auscultation without rales, wheezing or rhonchi  ABDOMEN: Soft, non-tender, non-distended EXTREMITIES:  No edema; No deformity   ASSESSMENT AND PLAN: .     HFrEF Reduced ejection fraction (25-30%) with fluid overload. Patient is on two diuretics and has had improvement in fluid status. -Continue current diuretic regimen. -euvolemic on exam -Plan to recheck echocardiogram in 8 weeks to assess response to therapy.  Hyperlipidemia LDL 119, goal is less than 70 due to history of stroke and coronary artery calcification. -Encourage dietary modifications to lower lipid  levels. -Recheck lipid panel in 8 weeks.  Stroke Right-sided weakness and tremor, with gait instability. No follow-up arranged with neurology after hospital discharge. -Arrange initial follow-up with neurology. -Order carotid ultrasound to assess for stenosis.  Poor Appetite/Weight Loss Lost 25 pounds and has poor appetite. -Encourage use of Glycernia protein shakes as meal replacement or supplement. -Discuss poor appetite and weight loss with neurology at follow-up appointment.  Medication Refills Current medications are nearly empty. -Send 90-day supply refills to [facility].  General Health Maintenance / Followup Plans -Follow-up after ultrasound and lipid panel recheck. -Consider cardiac catheterization if new symptoms of chest pain or shortness of breath develop.   Dispo: He can follow-up in 2-3 months  Signed, Sharlene Dory, PA-C

## 2023-05-21 NOTE — H&P (View-Only) (Signed)
 Cardiology Office Note:  .   Date:  05/22/2023  ID:  Kevin Flynn, DOB 09-16-1945, MRN [id number] [facility]: [facility] Providers Cardiologist:  None {  History of Present Illness: .   Kevin Flynn is a 78 y.o. male with a past medical history of hypertension, diabetes, OSA not on CPAP, baseline tremor who is seen for evaluation 04/21/2023 of new onset HFrEF.  [facility] denies any family history of coronary disease.  Previous smoker x 4-pack-year history.  Recent alcohol use however stopped recently.  Intermittent marijuana use.  He was having some motor function weakness while [place] a [place] as well as possible slurred speech reported by his wife and some gait issues.  On admission, he was noted to be tachypneic and incidentally found to have a pleural effusion that was felt to be parapneumonic due to possible aspiration event.  He was placed on prophylactic antibiotics.  CT confirmed small right greater than left pleural effusion with passive atelectasis.  He was also given 1 dose of IV Lasix 40 mg and reportedly had good urine output and improvement in tachypnea/SOB.  Echo was obtained that showed newly reduced EF 25 to 30% with regional wall motion abnormalities concerning for LAD infarct.  Mildly reduced RV function with elevated PASP 62.1.  No LV thrombus noted.  Cardiology was consulted for newly discovered HFrEF and cardiomyopathy.  When he was last seen 04/21/2023 he continued to have some motor defects however was doing well.  Reported for the last few months he had progressive disease and exercise intolerance of previously noted to have a brisk gait and able to ambulate without difficulty however more recently has had a difficult time and has been shuffling and more short of breath and tachypneic.  However, patient denies chest pain, orthopnea, PND, palpitations, falls, syncope, peripheral edema.  Also reported unintentional weight loss of 25+ pounds  due to decreased appetite.  Also noted to have leukopenia on admission.  CT scan was noted to have aortic atherosclerosis and coronary calcifications.  TSH was normal.  Troponin 18 and 18.  BNP 973.  Today, he presents with a history of stroke and recent hospitalization, with ongoing motor defects and new onset of tremors in the right hand. He reports a brisk gait that has transitioned to a shuffle, along with shortness of breath. He has also experienced significant weight loss of twenty-five pounds due to poor appetite. During his recent hospital stay, a CT scan revealed calcifications in the coronary arteries, and lab work indicated fluid retention. The patient was started on new medications, including diuretics, to manage these conditions. An echocardiogram revealed a low heart pump function, for which he was started on new heart medications. The patient's spouse reports that the patient's appetite has not improved significantly since discharge, and the patient's right hand tremor has worsened.  Reports no shortness of breath nor dyspnea on exertion. Reports no chest pain, pressure, or tightness. No edema, orthopnea, PND. Reports no palpitations.   Discussed the use of AI scribe software for clinical note transcription with the patient, who gave verbal consent to proceed.   ROS: Pertinent ROS in HPI  Studies Reviewed: .        Echocardiogram 04/21/2023 1. Left ventricular ejection fraction, by estimation, is 25 to 30%. The  left ventricle has severely decreased function. The left ventricle  demonstrates regional wall motion abnormalities with mid to apical severe  hypokinesis to akinesis, mid to apical  anterior akinesis, akinesis of the apex and peri-apical segments. This  suggests prior LAD territory infarction. No LV thrombus noted. The left  ventricular internal cavity size was mildly dilated. Left ventricular  diastolic parameters are consistent with  Grade II diastolic dysfunction  (pseudonormalization). Elevated left atrial  pressure.   2. Right ventricular systolic function is mildly reduced. The right  ventricular size is normal. There is severely elevated pulmonary artery  systolic pressure. The estimated right ventricular systolic pressure is  62.1 mmHg.   3. Left atrial size was mildly dilated.   4. Right atrial size was mildly dilated.   5. The mitral valve is normal in structure. Trivial mitral valve  regurgitation. No evidence of mitral stenosis.   6. The aortic valve is tricuspid. There is mild calcification of the  aortic valve. Aortic valve regurgitation is mild. No aortic stenosis is  present.   7. Aortic dilatation noted. There is mild dilatation of the aortic root,  measuring 38 mm. There is mild dilatation of the ascending aorta,  measuring 42 mm.   8. The inferior vena cava is dilated in size with <50% respiratory  variability, suggesting right atrial pressure of 15 mmHg.       Physical Exam:   VS:  BP 110/76   Pulse 83   Ht 5\' 8"  (1.727 m)   Wt 214 lb (97.1 kg)   SpO2 96%   BMI 32.54 kg/m    Wt Readings from Last 3 Encounters:  05/22/23 214 lb (97.1 kg)  04/21/23 214 lb 1.1 oz (97.1 kg)  03/02/13 240 lb (108.9 kg)    GEN: Well nourished, well developed in no acute distress NECK: No JVD; No carotid bruits CARDIAC: RRR, no murmurs, rubs, gallops RESPIRATORY:  Clear to auscultation without rales, wheezing or rhonchi  ABDOMEN: Soft, non-tender, non-distended EXTREMITIES:  No edema; No deformity   ASSESSMENT AND PLAN: .     HFrEF Reduced ejection fraction (25-30%) with fluid overload. Patient is on two diuretics and has had improvement in fluid status. -Continue current diuretic regimen. -euvolemic on exam -Plan to recheck echocardiogram in 8 weeks to assess response to therapy.  Hyperlipidemia LDL 119, goal is less than 70 due to history of stroke and coronary artery calcification. -Encourage dietary modifications to lower lipid  levels. -Recheck lipid panel in 8 weeks.  Stroke Right-sided weakness and tremor, with gait instability. No follow-up arranged with neurology after hospital discharge. -Arrange initial follow-up with neurology. -Order carotid ultrasound to assess for stenosis.  Poor Appetite/Weight Loss Lost 25 pounds and has poor appetite. -Encourage use of Glycernia protein shakes as meal replacement or supplement. -Discuss poor appetite and weight loss with neurology at follow-up appointment.  Medication Refills Current medications are nearly empty. -Send 90-day supply refills to [facility].  General Health Maintenance / Followup Plans -Follow-up after ultrasound and lipid panel recheck. -Consider cardiac catheterization if new symptoms of chest pain or shortness of breath develop.   Dispo: He can follow-up in 2-3 months  Signed, Sharlene Dory, PA-C

## 2023-05-22 ENCOUNTER — Encounter: Payer: Self-pay | Admitting: Physician Assistant

## 2023-05-22 ENCOUNTER — Ambulatory Visit: Payer: No Typology Code available for payment source | Attending: Physician Assistant | Admitting: Physician Assistant

## 2023-05-22 VITALS — BP 110/76 | HR 83 | Ht 68.0 in | Wt 214.0 lb

## 2023-05-22 DIAGNOSIS — I639 Cerebral infarction, unspecified: Secondary | ICD-10-CM | POA: Diagnosis not present

## 2023-05-22 DIAGNOSIS — E785 Hyperlipidemia, unspecified: Secondary | ICD-10-CM

## 2023-05-22 DIAGNOSIS — G4733 Obstructive sleep apnea (adult) (pediatric): Secondary | ICD-10-CM

## 2023-05-22 DIAGNOSIS — I5020 Unspecified systolic (congestive) heart failure: Secondary | ICD-10-CM

## 2023-05-22 MED ORDER — ATORVASTATIN CALCIUM 40 MG PO TABS: 40.0000 mg | ORAL_TABLET | Freq: Every day | ORAL | 3 refills | Status: DC

## 2023-05-22 MED ORDER — APIXABAN 5 MG PO TABS: 5.0000 mg | ORAL_TABLET | Freq: Two times a day (BID) | ORAL | 3 refills | Status: DC

## 2023-05-22 MED ORDER — FUROSEMIDE 40 MG PO TABS: 40.0000 mg | ORAL_TABLET | ORAL | 3 refills | Status: DC

## 2023-05-22 MED ORDER — METOPROLOL SUCCINATE ER 25 MG PO TB24: 25.0000 mg | ORAL_TABLET | Freq: Every day | ORAL | 3 refills | Status: DC

## 2023-05-22 MED ORDER — POTASSIUM CHLORIDE CRYS ER 20 MEQ PO TBCR: 40.0000 meq | EXTENDED_RELEASE_TABLET | ORAL | 3 refills | Status: AC

## 2023-05-22 MED ORDER — LOSARTAN POTASSIUM 25 MG PO TABS: 25.0000 mg | ORAL_TABLET | Freq: Every day | ORAL | 3 refills | Status: DC

## 2023-05-22 NOTE — Patient Instructions (Signed)
 Medication Instructions:   Your physician recommends that you continue on your current medications as directed. Please refer to the Current Medication list given to you today.   *If you need a refill on your cardiac medications before your next appointment, please call your pharmacy*   Lab Work:   RETURN IN 8 WEEKS  LFT AND LIPIDS  : FIRST FLOOR [address] [facility]  [address]    If you have labs (blood work) drawn today and your tests are completely normal, you will receive your results only by: MyChart Message (if you have MyChart) OR A paper copy in the mail If you have any lab test that is abnormal or we need to change your treatment, we will call you to review the results.    Testing/Procedures: Your physician has requested that you have an echocardiogram. Echocardiography is a painless test that uses sound waves to create images of your heart. It provides your doctor with information about the size and shape of your heart and how well your heart's chambers and valves are working. This procedure takes approximately one hour. There are no restrictions for this procedure. Please do NOT wear cologne, perfume, aftershave, or lotions (deodorant is allowed). Please arrive 15 minutes prior to your appointment time.  Please note: We ask at that you not bring children with you during ultrasound (echo/ vascular) testing. Due to room size and safety concerns, children are not allowed in the ultrasound rooms during exams. Our front office staff cannot provide observation of children in our lobby area while testing is being conducted. An adult accompanying a patient to their appointment will only be allowed in the ultrasound room at the discretion of the ultrasound technician under special circumstances. We apologize for any inconvenience.   Your physician has requested that you have a carotid duplex. This test is an ultrasound of the carotid arteries in your neck. It looks at blood flow through  these arteries that supply the brain with blood. Allow one hour for this exam. There are no restrictions or special instructions.   Follow-Up: At [facility], you and your health needs are our priority.  As part of our continuing mission to provide you with exceptional heart care, we have created designated Provider Care Teams.  These Care Teams include your primary Cardiologist (physician) and Advanced Practice Providers (APPs -  Physician Assistants and Nurse Practitioners) who all work together to provide you with the care you need, when you need it.  We recommend signing up for the patient portal called "MyChart".  Sign up information is provided on this After Visit Summary.  MyChart is used to connect with patients for Virtual Visits (Telemedicine).  Patients are able to view lab/test results, encounter notes, upcoming appointments, etc.  Non-urgent messages can be sent to your provider as well.   To learn more about what you can do with MyChart, go to [email or web address].    Your next appointment:  REFERRED TO [facility]   2-3  month(s)  Provider:   DR Albert Huff   Other Instructions       1st Floor: - Lobby - Registration  - Pharmacy  - Lab - Cafe  2nd Floor: - PV Lab - Diagnostic Testing (echo, CT, nuclear med)  3rd Floor: - Vacant  4th Floor: - TCTS (cardiothoracic surgery) - AFib Clinic - Structural Heart Clinic - Vascular Surgery  - Vascular Ultrasound  5th Floor: - HeartCare Cardiology (general and EP) - Clinical Pharmacy for coumadin,  hypertension, lipid, weight-loss medications, and med management appointments    Valet parking services will be available as well.

## 2023-05-25 ENCOUNTER — Other Ambulatory Visit (HOSPITAL_COMMUNITY): Payer: Self-pay

## 2023-05-30 NOTE — Pre-Procedure Instructions (Signed)
Received a call from [place]- heart failure they are requesting for cath to be done here. Dr Swaziland has reviewed documents from [place].  Will set patient up for cath.  Spoke to patient's wife Kevin Flynn, arranged cath for Thursday  06/08/23 @ 0900.  Patient to arrive @ 0630 he will need labs on arrival.  They are aware nothing to eat or drink after midnight on Wednesday night.  He will hold his Eliquis on Tuesday 2/25 and Wednesday 2/26, and no Eliquis am of procedure.

## 2023-06-05 ENCOUNTER — Telehealth: Payer: Self-pay | Admitting: Cardiology

## 2023-06-05 NOTE — Telephone Encounter (Signed)
 Wife states she is returning a call from a Christine regarding patient's upcoming procedure with Dr. Swaziland. She states they have been playing phone tag, and keep missing each other. Wife states she has been trying to contact her while she's home so she can give her husband permission to speak with her. Dr. Elvis Coil nurse didn't call + nothing documented in chart. Please advise.

## 2023-06-06 ENCOUNTER — Telehealth: Payer: Self-pay | Admitting: *Deleted

## 2023-06-06 NOTE — Telephone Encounter (Signed)
 Patient's wife reports pt not currently taking Eliquis-[facility].

## 2023-06-06 NOTE — Telephone Encounter (Signed)
 Cardiac Catheterization scheduled at [facility] for: Thursday June 08, 2023 9 AM Arrival time [facility] Main Entrance A at: 6:30 AM-needs lab  Nothing to eat after midnight prior to procedure, clear liquids until 5 AM day of procedure.  Medication instructions: -Hold:  Jardiance-AM of procedure  Insulin-1/2 Insulin dose HS prior to procedure -Other usual morning medications can be taken with sips of water including aspirin 81 mg.  Patient's wife reports lasix weekly on Fridays, does not use Insulin in the AM.  Patient's wife reports pt not currently taking Eliquis-[facility].  Plan to go home the same day, you will only stay overnight if medically necessary.  You must have responsible adult to drive you home.  Someone must be with you the first 24 hours after you arrive home.  Reviewed procedure instructions with patient's wife Adela Lank with patient's verbal permission.

## 2023-06-06 NOTE — Telephone Encounter (Signed)
 Cath 2/27 Message received from [place], [place], [place] [facility]:  I have his paper work from the [place]. He is going to be here at 0630, needs labs Received a call from [place]- heart failure they are requesting for cath to be done here. Dr Swaziland has reviewed documents from [place]. Will set patient up for cath. Spoke to patient's wife Adela Lank, arranged cath for Thursday 06/08/23 @ 0900. Patient to arrive @ 0630 he will need labs on arrival. They are aware nothing to eat or drink after midnight on Wednesday night. He will hold his Eliquis on Tuesday 2/25 and Wednesday 2/26, and no Eliquis am of procedure.

## 2023-06-06 NOTE — Telephone Encounter (Signed)
 Called and spoke with wife and make an appointment for May 20th 2025 at 10;am with Dr Swaziland.

## 2023-06-08 ENCOUNTER — Other Ambulatory Visit: Payer: Self-pay

## 2023-06-08 ENCOUNTER — Encounter (HOSPITAL_COMMUNITY)
Admission: RE | Disposition: A | Discharge status: Home / Self Care | Payer: Self-pay | Source: Home / Self Care | Attending: Cardiology

## 2023-06-08 ENCOUNTER — Ambulatory Visit (HOSPITAL_COMMUNITY)
Admission: RE | Admit: 2023-06-08 | Discharge: 2023-06-08 | Disposition: A | Discharge status: Home / Self Care | Payer: No Typology Code available for payment source | Attending: Cardiology | Admitting: Cardiology

## 2023-06-08 ENCOUNTER — Encounter (HOSPITAL_COMMUNITY): Payer: Self-pay | Admitting: Cardiology

## 2023-06-08 DIAGNOSIS — I272 Pulmonary hypertension, unspecified: Secondary | ICD-10-CM | POA: Diagnosis not present

## 2023-06-08 DIAGNOSIS — Z8673 Personal history of transient ischemic attack (TIA), and cerebral infarction without residual deficits: Secondary | ICD-10-CM | POA: Diagnosis not present

## 2023-06-08 DIAGNOSIS — I2582 Chronic total occlusion of coronary artery: Secondary | ICD-10-CM | POA: Insufficient documentation

## 2023-06-08 DIAGNOSIS — Z6832 Body mass index (BMI) 32.0-32.9, adult: Secondary | ICD-10-CM | POA: Insufficient documentation

## 2023-06-08 DIAGNOSIS — I5020 Unspecified systolic (congestive) heart failure: Secondary | ICD-10-CM

## 2023-06-08 DIAGNOSIS — R630 Anorexia: Secondary | ICD-10-CM | POA: Insufficient documentation

## 2023-06-08 DIAGNOSIS — E785 Hyperlipidemia, unspecified: Secondary | ICD-10-CM | POA: Insufficient documentation

## 2023-06-08 DIAGNOSIS — G4733 Obstructive sleep apnea (adult) (pediatric): Secondary | ICD-10-CM | POA: Diagnosis not present

## 2023-06-08 DIAGNOSIS — E119 Type 2 diabetes mellitus without complications: Secondary | ICD-10-CM | POA: Diagnosis not present

## 2023-06-08 DIAGNOSIS — I2510 Atherosclerotic heart disease of native coronary artery without angina pectoris: Secondary | ICD-10-CM | POA: Diagnosis not present

## 2023-06-08 DIAGNOSIS — I110 Hypertensive heart disease with heart failure: Secondary | ICD-10-CM | POA: Diagnosis present

## 2023-06-08 DIAGNOSIS — Z79899 Other long term (current) drug therapy: Secondary | ICD-10-CM | POA: Diagnosis not present

## 2023-06-08 DIAGNOSIS — I5022 Chronic systolic (congestive) heart failure: Secondary | ICD-10-CM | POA: Insufficient documentation

## 2023-06-08 HISTORY — PX: RIGHT/LEFT HEART CATH AND CORONARY ANGIOGRAPHY: CATH118266

## 2023-06-08 LAB — POCT I-STAT 7, (LYTES, BLD GAS, ICA,H+H)
Acid-Base Excess: 0 mmol/L (ref 0.0–2.0)
Bicarbonate: 25.7 mmol/L (ref 20.0–28.0)
Calcium, Ion: 1.22 mmol/L (ref 1.15–1.40)
HCT: 48 % (ref 39.0–52.0)
Hemoglobin: 16.3 g/dL (ref 13.0–17.0)
O2 Saturation: 93 %
Potassium: 4 mmol/L (ref 3.5–5.1)
Sodium: 142 mmol/L (ref 135–145)
TCO2: 27 mmol/L (ref 22–32)
pCO2 arterial: 44.3 mm[Hg] (ref 32–48)
pH, Arterial: 7.372 (ref 7.35–7.45)
pO2, Arterial: 68 mm[Hg] — ABNORMAL LOW (ref 83–108)

## 2023-06-08 LAB — POCT I-STAT EG7
Acid-Base Excess: 2 mmol/L (ref 0.0–2.0)
Acid-Base Excess: 2 mmol/L (ref 0.0–2.0)
Bicarbonate: 28.2 mmol/L — ABNORMAL HIGH (ref 20.0–28.0)
Bicarbonate: 28.2 mmol/L — ABNORMAL HIGH (ref 20.0–28.0)
Calcium, Ion: 1.26 mmol/L (ref 1.15–1.40)
Calcium, Ion: 1.25 mmol/L (ref 1.15–1.40)
HCT: 48 % (ref 39.0–52.0)
HCT: 48 % (ref 39.0–52.0)
Hemoglobin: 16.3 g/dL (ref 13.0–17.0)
Hemoglobin: 16.3 g/dL (ref 13.0–17.0)
O2 Saturation: 62 %
O2 Saturation: 64 %
Potassium: 4.2 mmol/L (ref 3.5–5.1)
Potassium: 4.1 mmol/L (ref 3.5–5.1)
Sodium: 143 mmol/L (ref 135–145)
Sodium: 143 mmol/L (ref 135–145)
TCO2: 30 mmol/L (ref 22–32)
TCO2: 30 mmol/L (ref 22–32)
pCO2, Ven: 49.6 mm[Hg] (ref 44–60)
pCO2, Ven: 50.5 mm[Hg] (ref 44–60)
pH, Ven: 7.363 (ref 7.25–7.43)
pH, Ven: 7.355 (ref 7.25–7.43)
pO2, Ven: 34 mm[Hg] (ref 32–45)
pO2, Ven: 35 mm[Hg] (ref 32–45)

## 2023-06-08 LAB — GLUCOSE, CAPILLARY
Glucose-Capillary: 104 mg/dL — ABNORMAL HIGH (ref 70–99)
Glucose-Capillary: 64 mg/dL — ABNORMAL LOW (ref 70–99)
Glucose-Capillary: 69 mg/dL — ABNORMAL LOW (ref 70–99)
Glucose-Capillary: 94 mg/dL (ref 70–99)

## 2023-06-08 LAB — CBC
HCT: 51.2 % (ref 39.0–52.0)
Hemoglobin: 16.7 g/dL (ref 13.0–17.0)
MCH: 29 pg (ref 26.0–34.0)
MCHC: 32.6 g/dL (ref 30.0–36.0)
MCV: 89 fL (ref 80.0–100.0)
Platelets: 203 K/uL (ref 150–400)
RBC: 5.75 MIL/uL (ref 4.22–5.81)
RDW: 15.9 % — ABNORMAL HIGH (ref 11.5–15.5)
WBC: 4.7 K/uL (ref 4.0–10.5)
nRBC: 0 % (ref 0.0–0.2)

## 2023-06-08 LAB — BASIC METABOLIC PANEL WITH GFR
Anion gap: 9 (ref 5–15)
BUN: 20 mg/dL (ref 8–23)
CO2: 26 mmol/L (ref 22–32)
Calcium: 9.4 mg/dL (ref 8.9–10.3)
Chloride: 104 mmol/L (ref 98–111)
Creatinine, Ser: 1.13 mg/dL (ref 0.61–1.24)
GFR, Estimated: >60 mL/min
Glucose, Bld: 103 mg/dL — ABNORMAL HIGH (ref 70–99)
Potassium: 4.1 mmol/L (ref 3.5–5.1)
Sodium: 139 mmol/L (ref 135–145)

## 2023-06-08 SURGERY — RIGHT/LEFT HEART CATH AND CORONARY ANGIOGRAPHY
Anesthesia: LOCAL

## 2023-06-08 MED ORDER — HEPARIN (PORCINE) IN NACL 1000-0.9 UT/500ML-% IV SOLN
INTRAVENOUS | Status: DC | PRN
  Administered 2023-06-08: 1000 mL

## 2023-06-08 MED ORDER — MIDAZOLAM HCL 2 MG/2ML IJ SOLN
INTRAMUSCULAR | Status: AC
  Filled 2023-06-08: qty 2

## 2023-06-08 MED ORDER — FENTANYL CITRATE (PF) 100 MCG/2ML IJ SOLN
INTRAMUSCULAR | Status: AC
  Filled 2023-06-08: qty 2

## 2023-06-08 MED ORDER — LIDOCAINE HCL (PF) 1 % IJ SOLN
INTRAMUSCULAR | Status: AC
  Filled 2023-06-08: qty 30

## 2023-06-08 MED ORDER — VERAPAMIL HCL 2.5 MG/ML IV SOLN
INTRAVENOUS | Status: DC | PRN
  Administered 2023-06-08: 10 mL via INTRA_ARTERIAL

## 2023-06-08 MED ORDER — HYDRALAZINE HCL 20 MG/ML IJ SOLN: 10.0000 mg | INTRAMUSCULAR | Status: DC | PRN

## 2023-06-08 MED ORDER — ACETAMINOPHEN 325 MG PO TABS: 650.0000 mg | ORAL_TABLET | ORAL | Status: DC | PRN

## 2023-06-08 MED ORDER — ASPIRIN 81 MG PO CHEW
81.0000 mg | CHEWABLE_TABLET | ORAL | Status: AC
  Administered 2023-06-08: 81 mg via ORAL
  Filled 2023-06-08: qty 1

## 2023-06-08 MED ORDER — LIDOCAINE HCL (PF) 1 % IJ SOLN
INTRAMUSCULAR | Status: DC | PRN
  Administered 2023-06-08: 5 mL

## 2023-06-08 MED ORDER — SODIUM CHLORIDE 0.9 % IV SOLN: INTRAVENOUS | Status: DC

## 2023-06-08 MED ORDER — IOHEXOL 350 MG/ML SOLN
INTRAVENOUS | Status: DC | PRN
  Administered 2023-06-08: 120 mL

## 2023-06-08 MED ORDER — MIDAZOLAM HCL 2 MG/2ML IJ SOLN
INTRAMUSCULAR | Status: DC | PRN
  Administered 2023-06-08: 1 mg via INTRAVENOUS

## 2023-06-08 MED ORDER — SODIUM CHLORIDE 0.9% FLUSH: 3.0000 mL | INTRAVENOUS | Status: DC | PRN

## 2023-06-08 MED ORDER — VERAPAMIL HCL 2.5 MG/ML IV SOLN
INTRAVENOUS | Status: AC
  Filled 2023-06-08: qty 2

## 2023-06-08 MED ORDER — SODIUM CHLORIDE 0.9 % IV SOLN: 250.0000 mL | INTRAVENOUS | Status: DC | PRN

## 2023-06-08 MED ORDER — SODIUM CHLORIDE 0.9% FLUSH: 3.0000 mL | Freq: Two times a day (BID) | INTRAVENOUS | Status: DC

## 2023-06-08 MED ORDER — ONDANSETRON HCL 4 MG/2ML IJ SOLN: 4.0000 mg | Freq: Four times a day (QID) | INTRAMUSCULAR | Status: DC | PRN

## 2023-06-08 MED ORDER — HEPARIN SODIUM (PORCINE) 1000 UNIT/ML IJ SOLN
INTRAMUSCULAR | Status: AC
  Filled 2023-06-08: qty 10

## 2023-06-08 MED ORDER — HEPARIN SODIUM (PORCINE) 1000 UNIT/ML IJ SOLN
INTRAMUSCULAR | Status: DC | PRN
  Administered 2023-06-08: 4500 [IU] via INTRAVENOUS

## 2023-06-08 MED ORDER — FENTANYL CITRATE (PF) 100 MCG/2ML IJ SOLN
INTRAMUSCULAR | Status: DC | PRN
  Administered 2023-06-08: 25 ug via INTRAVENOUS

## 2023-06-08 SURGICAL SUPPLY — 18 items
CATH 5FR JL3.5 JR4 ANG PIG MP (CATHETERS) IMPLANT
CATH BALLN WEDGE 5F 110CM (CATHETERS) IMPLANT
CATH INFINITI 5FR JL5 (CATHETERS) IMPLANT
CATH LAUNCHER 5F AL1 (CATHETERS) IMPLANT
CATH LAUNCHER 5F NOTO (CATHETERS) IMPLANT
CATH LAUNCHER 5F RADR (CATHETERS) IMPLANT
CATHETER LAUNCHER 5F AL1 (CATHETERS) ×1 IMPLANT
CATHETER LAUNCHER 5F NOTO (CATHETERS) ×1 IMPLANT
CATHETER LAUNCHER 5F RADR (CATHETERS) ×1 IMPLANT
DEVICE RAD COMP TR BAND LRG (VASCULAR PRODUCTS) IMPLANT
GLIDESHEATH SLEND SS 6F .021 (SHEATH) IMPLANT
GUIDEWIRE INQWIRE 1.5J.035X260 (WIRE) IMPLANT
INQWIRE 1.5J .035X260CM (WIRE) ×1 IMPLANT
PACK CARDIAC CATHETERIZATION (CUSTOM PROCEDURE TRAY) ×1 IMPLANT
SET ATX-X65L (MISCELLANEOUS) IMPLANT
SHEATH 6FR 85 DEST SLENDER (SHEATH) IMPLANT
SHEATH GLIDE SLENDER 4/5FR (SHEATH) IMPLANT
SHEATH PROBE COVER 6X72 (BAG) IMPLANT

## 2023-06-08 NOTE — Discharge Instructions (Addendum)
 May resume Eliquis this evening  Drink plenty of fluid for 48 hours and keep wrist elevated at heart level for 24 hours  Radial Site Care   This sheet gives you information about how to care for yourself after your procedure. Your health care provider may also give you more specific instructions. If you have problems or questions, contact your health care provider. What can I expect after the procedure? After the procedure, it is common to have: Bruising and tenderness at the catheter insertion area. Follow these instructions at home: Medicines Take over-the-counter and prescription medicines only as told by your health care provider. Insertion site care Follow instructions from your health care provider about how to take care of your insertion site. Make sure you: Wash your hands with soap and water before you change your bandage (dressing). If soap and water are not available, use hand sanitizer. remove your dressing as told by your health care provider. In 24 hours Check your insertion site every day for signs of infection. Check for: Redness, swelling, or pain. Fluid or blood. Pus or a bad smell. Warmth. Do not take baths, swim, or use a hot tub until your health care provider approves. You may shower 24-48 hours after the procedure, or as directed by your health care provider. Remove the dressing and gently wash the site with plain soap and water. Pat the area dry with a clean towel. Do not rub the site. That could cause bleeding. Do not apply powder or lotion to the site. Activity   For 24 hours after the procedure, or as directed by your health care provider: Do not flex or bend the affected arm. Do not push or pull heavy objects with the affected arm. Do not drive yourself home from the hospital or clinic. You may drive 24 hours after the procedure unless your health care provider tells you not to. Do not operate machinery or power tools. Do not lift anything that is  heavier than 10 lb (4.5 kg), or the limit that you are told, until your health care provider says that it is safe. For 4 days Ask your health care provider when it is okay to: Return to work or school. Resume usual physical activities or sports. Resume sexual activity. General instructions If the catheter site starts to bleed, raise your arm and put firm pressure on the site. If the bleeding does not stop, get help right away. This is a medical emergency. If you went home on the same day as your procedure, a responsible adult should be with you for the first 24 hours after you arrive home. Keep all follow-up visits as told by your health care provider. This is important. Contact a health care provider if: You have a fever. You have redness, swelling, or yellow drainage around your insertion site. Get help right away if: You have unusual pain at the radial site. The catheter insertion area swells very fast. The insertion area is bleeding, and the bleeding does not stop when you hold steady pressure on the area. Your arm or hand becomes pale, cool, tingly, or numb. These symptoms may represent a serious problem that is an emergency. Do not wait to see if the symptoms will go away. Get medical help right away. Call your local emergency services (911 in the [place] Do not drive yourself to the hospital. Summary After the procedure, it is common to have bruising and tenderness at the site. Follow instructions from your health care provider about how  to take care of your radial site wound. Check the wound every day for signs of infection. Do not lift anything that is heavier than 10 lb (4.5 kg), or the limit that you are told, until your health care provider says that it is safe. This information is not intended to replace advice given to you by your health care provider. Make sure you discuss any questions you have with your health care provider. Document Revised: 05/03/2017 Document Reviewed:  05/03/2017 Elsevier Patient Education  2020 [employer].

## 2023-06-08 NOTE — Progress Notes (Signed)
 Patient and wife was given discharge instructions. Both verbalized understanding.

## 2023-06-08 NOTE — Interval H&P Note (Signed)
 History and Physical Interval Note:  06/08/2023 8:16 AM  Odean Fester  has presented today for surgery, with the diagnosis of hf.  The various methods of treatment have been discussed with the patient and family. After consideration of risks, benefits and other options for treatment, the patient has consented to  Procedure(s): RIGHT/LEFT HEART CATH AND CORONARY ANGIOGRAPHY (N/A) as a surgical intervention.  The patient's history has been reviewed, patient examined, no change in status, stable for surgery.  I have reviewed the patient's chart and labs.  Questions were answered to the patient's satisfaction.    Cath Lab Visit (complete for each Cath Lab visit)  Clinical Evaluation Leading to the Procedure:   ACS: No.  Non-ACS:    Anginal Classification: CCS II  Anti-ischemic medical therapy: Minimal Therapy (1 class of medications)  Non-Invasive Test Results: No non-invasive testing performed  Prior CABG: No previous CABG       Theron Arista [facility] 06/08/2023 8:16 AM

## 2023-06-09 ENCOUNTER — Other Ambulatory Visit (HOSPITAL_COMMUNITY): Payer: Self-pay

## 2023-06-21 NOTE — Progress Notes (Deleted)
 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  Once EF greater than 30-35%, may switch to ASA 81mg  daily Statin.  LDL goal less than 70 Normotensive blood pressure Hgb A1c goal less than 7  Parkinsonism workup and treatment managed by neurologist at the [facility]. Follow up 6 months.   Subjective:  Kevin Flynn is a 78 year old ***-handed male with HTN, DM 2, OSA and depression and remote history of smoking who presents for stroke.  History supplemented by hospital records.  MRIs of brain/c-spine and CTA head and neck personally reviewed.  Around the new year, he developed new onset right arm numbness.  Noted mild symptoms in the right leg as well.  No associated weakness or neck or radicular pain.  Around that time, he had an outpatient CT of the head that was negative.  Symptoms didn't resolve, so he went to [facility] ER on 1/9 where MRI of brain revealed acute and subacute infarcts in the left posterior frontal lobe, embolic in nature.Marland Kitchen  He was transferred to [facility] for further workup.  CTA head and neck revealed moderate stenosis in the distal left M1, mild stenosis right cavernous and supraclinoid ICA, opacification of the distal basilar artery may be retrograde, and severe stenosis in the mid right P2 and proximal left P2 with PCAs primarily supplied by the posterior communicating arteries.  2D echo revealed newly reduced EF of 25-30% with mildly dilated left atria, negative for shunt.  LDL 119.  Hgb A1c 5.6.  UDS negative.  Due to the new  onset heart failure, he was noted to have pleural effusion and given IV Lasix.  He was on no antithrombotic therapy prior to admission and was discharged on Eliquis with plan to repeat 2D echo in 3 months and may switch to antiplatelet therapy once EF greater than 30-35%.  Started on atorvastatin 40mg  daily.    He has followed up with cardiology.  Underwent cardiac cath on 06/08/2023 which revealed severe 2 vessel obstructive CAD.  Repeat 2D echo has been ordered.  Of note, he is followed by neurologist, Dr. 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  He has also had workup for Parkinson's disease.  He started having right hand tremor in 2016 which subsequently spread to both hands and got worse.  Noticed with movement and not rest.  Had side effects to primidone.  Also noted to be moving slowly.  Sometimes has bouts of vertigo when going uphill.  Short term memory problems.  He was evaluated at the [facility] in October 2020 by Dr. Lieutenant Diego and diagnosed with essential tremor.  ***.  His neurologist ordered a DaT scan perfomred on 07/29/2022 which was normal.    04/20/2023 MRI BRAIN WO:  Acute and subacute infarcts in the posterior left frontal cortex, which includes the motor strip. 04/20/2023 MRI C-SPINE WO:  Loss of the left vertebral artery flow void in the V2, V3, and V4 segments, which is concerning for occlusion or slow flow.  C3-C4 moderate right neural  foraminal narrowing.  C6-C7 mild left neural foraminal narrowing.  No spinal canal stenosis. 04/20/2023 CTA HEAD & NECK W WO::  1. Evaluation is limited by bolus timing, despite multiple attempts to time the contrast. Given the slow is with which the contrast passed from the pulmonary arteries to the aorta, a cardiac etiology is suspected. 2. The intracranial and extracranial vertebral arteries are not sufficiently opacified for adequate evaluation. The PCAs are  primarily supplied by the posterior communicating arteries. Opacification of the distal basilar artery may be retrograde. 3. Moderate stenosis in the distal left M1. Mild stenosis right cavernous and supraclinoid ICA. 4. Severe stenosis in the mid right P2 and proximal left P2. 5. 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 04/21/2023 2D ECHOCARDIOGRAM:  LVEF 25 to 30%, regional wall abnormalities consistent with possible LAD infarct, no LV thrombus noted, grade 2 diastolic dysfunction, mildly reduced RV function with severely elevated PASP with a RVSP of 62. 04/21/2023 VASC US LOWER EXTREM VENOUS BILAT:  negative for DVT bilaterally    PAST MEDICAL HISTORY: Past Medical History:  Diagnosis Date   Depression    Diabetes mellitus without complication (HCC)    Hypertension     PAST SURGICAL HISTORY: Past Surgical History:  Procedure Laterality Date   KNEE SURGERY Left tendon repair   RIGHT/LEFT HEART CATH AND CORONARY ANGIOGRAPHY N/A 06/08/2023   Procedure: RIGHT/LEFT HEART CATH AND CORONARY ANGIOGRAPHY;  Surgeon: Swazil[place], Peter M, MD;  Location: [facility] INVASIVE CV LAB;  Service: Cardiovascular;  Laterality: N/A;    MEDICATIONS: Current Outpatient Medications on File Prior to Visit  Medication Sig Dispense Refill   apixaban (ELIQUIS) 5 MG TABS tablet Take 1 tablet (5 mg total) by mouth 2 (two) times daily. 180 tablet 3   atorvastatin (LIPITOR) 40 MG tablet Take 40 mg by mouth at bedtime.     empagliflozin (JARDIANCE) 25 MG TABS tablet Take 12.5 mg by mouth every morning.     furosemide (LASIX) 40 MG tablet Take 1 tablet (40 mg total) by mouth once a week. 90 tablet 3   insulin glargine-yfgn (SEMGLEE) 100 UNIT/ML injection Inject 17 Units into the skin at bedtime.     metoprolol succinate (TOPROL-XL) 50 MG 24 hr tablet Take 25 mg by mouth daily. Take with or immediately following a meal.     Multiple Vitamins-Minerals (MULTIVITAMIN WITH MINERALS) tablet  Take 1 tablet by mouth daily.     potassium chloride SA (KLOR-CON M) 20 MEQ tablet Take 2 tablets (40 mEq total) by mouth once a week. 90 tablet 3   PRESCRIPTION MEDICATION Pt has CPAP Machine     sacubitril-valsartan (ENTRESTO) 24-26 MG Take 1 tablet by mouth 2 (two) times daily.     No current facility-administered medications on file prior to visit.    ALLERGIES: No Known Allergies  FAMILY HISTORY: No family history on file.  Objective:  *** General: No acute distress.  Patient appears well-groomed.   Head:  Normocephalic/atraumatic Eyes:  fundi examined but not visualized Neck: supple, no paraspinal tenderness, full range of motion Back: No paraspinal tenderness Heart: regular rate and rhythm Lungs: Clear to auscultation bilaterally. Vascular: No carotid bruits. Neurological Exam: Mental status: alert and oriented to person, place, and time, speech fluent and not dysarthric, language intact. Cranial nerves: CN I: not tested CN II: pupils equal, round and reactive to light, visual fields intact CN III, IV, VI:  full range of motion, no nystagmus, no ptosis CN V:  facial sensation intact. CN VII: upper and lower face symmetric CN VIII: hearing intact CN IX, X: gag intact, uvula midline CN XI: sternocleidomastoid and trapezius muscles intact CN XII: tongue midline Bulk & Tone: normal, no fasciculations. Motor:  muscle strength 5/5 throughout Sensation:  Pinprick, temperature and vibratory sensation intact. Deep Tendon Reflexes:  2+ throughout,  toes downgoing.   Finger to nose testing:  Without dysmetria.   Heel to shin:  Without dysmetria.   Gait:  Normal station and stride.  Romberg negative.    Thank you for allowing me to take part in the care of this patient.  Shon Millet, DO  CC: ***

## 2023-06-22 ENCOUNTER — Ambulatory Visit: Admitting: Neurology

## 2023-06-22 ENCOUNTER — Encounter: Payer: Self-pay | Admitting: Neurology

## 2023-06-23 ENCOUNTER — Other Ambulatory Visit (HOSPITAL_COMMUNITY): Payer: No Typology Code available for payment source

## 2023-06-23 ENCOUNTER — Encounter (HOSPITAL_COMMUNITY): Payer: No Typology Code available for payment source

## 2023-06-26 ENCOUNTER — Ambulatory Visit: Payer: No Typology Code available for payment source | Attending: Physician Assistant | Admitting: Physician Assistant

## 2023-06-26 NOTE — Progress Notes (Signed)
 This encounter was created in error - please disregard.

## 2023-07-26 ENCOUNTER — Other Ambulatory Visit (HOSPITAL_COMMUNITY): Payer: Self-pay

## 2023-07-27 ENCOUNTER — Other Ambulatory Visit (HOSPITAL_COMMUNITY): Payer: Self-pay

## 2023-07-28 ENCOUNTER — Ambulatory Visit: Payer: No Typology Code available for payment source | Admitting: Cardiology

## 2023-08-01 ENCOUNTER — Other Ambulatory Visit (HOSPITAL_COMMUNITY): Payer: Self-pay

## 2023-08-24 NOTE — Progress Notes (Signed)
 Cardiology Office Note:    Date:  08/29/2023   ID:  Kevin Flynn, DOB 1945-09-09, MRN [id number]  PCP:  [facility]   [facility] Providers Cardiologist:  None     Referring MD: [facility]   Chief Complaint  Patient presents with   Congestive Heart Failure   Coronary Artery Disease    History of Present Illness:    Kevin Flynn is a 78 y.o. male seen for follow up of CHF and CAD. He has a history of HTN, DM, HLD, prior tobacco and Etoh abuse. He was admitted in January with right sided weakness. MRI showed acute infarct in the left frontal cortex. He had a CT one week prior that was OK. Event was felt to be embolic. Echo showed EF 25-30%. He had a pleural effusion. He was anticoagulated with Eliquis . Placed on losartan , metoprolol  and lasix . Also Farxiga . CT showed coronary calcification. When seen in Feb he reported ongoing DOE but no chest pain. He underwent right and left  heart cath. This showed mildly elevated LV filling pressures and mild pulmonary HTN. The LAD was occluded from the proximal to mid vessel. There was 99% stenosis of the first diagonal and 90% of the PDA. Review of Echo showed that the anterior wall was akinetic. In absence of angina it was recommended that he be treated medically.   On follow up today he is seen with his wife. He feels very well. Denies any chest pain, dyspnea, palpitations or edema. Has gained about 10 lbs. Followed at [facility] in [place]. Had labs there in April. Tolerating medication well. Takes lasix  one day a week.  Past Medical History:  Diagnosis Date   Depression    Diabetes mellitus without complication (HCC)    Hypertension     Past Surgical History:  Procedure Laterality Date   KNEE SURGERY Left tendon repair   RIGHT/LEFT HEART CATH AND CORONARY ANGIOGRAPHY N/A 06/08/2023   Procedure: RIGHT/LEFT HEART CATH AND CORONARY ANGIOGRAPHY;  Surgeon: [place], September Mormile M, MD;  Location: [facility] INVASIVE CV LAB;  Service:  Cardiovascular;  Laterality: N/A;    Current Medications: Current Meds  Medication Sig   clonazePAM (KLONOPIN) 0.5 MG tablet Take by mouth daily.   sacubitril-valsartan (ENTRESTO) 49-51 MG Take 1 tablet by mouth 2 (two) times daily.   spironolactone (ALDACTONE) 25 MG tablet Take 25 mg by mouth daily.   [DISCONTINUED] apixaban  (ELIQUIS ) 5 MG TABS tablet Take 1 tablet (5 mg total) by mouth 2 (two) times daily.     Allergies:   Patient has no known allergies.   Social History   Socioeconomic History   Marital status: Married    Spouse name: Not on file   Number of children: Not on file   Years of education: Not on file   Highest education level: Not on file  Occupational History   Not on file  Tobacco Use   Smoking status: Former   Smokeless tobacco: Never  Substance and Sexual Activity   Alcohol use: Yes    Comment: occasionally   Drug use: No   Sexual activity: Not on file  Other Topics Concern   Not on file  Social History Narrative   Not on file   Social Drivers of Health   Financial Resource Strain: Not on file  Food Insecurity: No Food Insecurity (04/20/2023)   Hunger Vital Sign    Worried About Running Out of Food in the Last Year: Never true    Ran Out  of Food in the Last Year: Never true  Transportation Needs: No Transportation Needs (04/20/2023)   PRAPARE - Administrator, Civil Service (Medical): No    Lack of Transportation (Non-Medical): No  Physical Activity: Not on file  Stress: Not on file  Social Connections: Socially Integrated (04/20/2023)   Social Connection and Isolation Panel [NHANES]    Frequency of Communication with Friends and Family: More than three times a week    Frequency of Social Gatherings with Friends and Family: More than three times a week    Attends Religious Services: More than 4 times per year    Active Member of [employer] or Organizations: Yes    Attends Engineer, structural: More than 4 times per year    Marital  Status: Married     Family History: The patient's family history is not on file.  ROS:   Please see the history of present illness.     All other systems reviewed and are negative.  EKGs/Labs/Other Studies Reviewed:    The following studies were reviewed today: Echo 04/21/23: IMPRESSIONS     1. Left ventricular ejection fraction, by estimation, is 25 to 30%. The  left ventricle has severely decreased function. The left ventricle  demonstrates regional wall motion abnormalities with mid to apical severe  hypokinesis to akinesis, mid to apical  anterior akinesis, akinesis of the apex and peri-apical segments. This  suggests prior LAD territory infarction. No LV thrombus noted. The left  ventricular internal cavity size was mildly dilated. Left ventricular  diastolic parameters are consistent with  Grade II diastolic dysfunction (pseudonormalization). Elevated left atrial  pressure.   2. Right ventricular systolic function is mildly reduced. The right  ventricular size is normal. There is severely elevated pulmonary artery  systolic pressure. The estimated right ventricular systolic pressure is  62.1 mmHg.   3. Left atrial size was mildly dilated.   4. Right atrial size was mildly dilated.   5. The mitral valve is normal in structure. Trivial mitral valve  regurgitation. No evidence of mitral stenosis.   6. The aortic valve is tricuspid. There is mild calcification of the  aortic valve. Aortic valve regurgitation is mild. No aortic stenosis is  present.   7. Aortic dilatation noted. There is mild dilatation of the aortic root,  measuring 38 mm. There is mild dilatation of the ascending aorta,  measuring 42 mm.   8. The inferior vena cava is dilated in size with <50% respiratory  variability, suggesting right atrial pressure of 15 mmHg.   Cardiac cath 06/08/23:  RIGHT/LEFT HEART CATH AND CORONARY ANGIOGRAPHY   Conclusion      Prox LAD to Mid LAD lesion is 100% stenosed.    1st Diag lesion is 99% stenosed.   Lat 1st Diag lesion is 99% stenosed.   Ost Cx to Prox Cx lesion is 30% stenosed.   Mid Cx lesion is 40% stenosed.   RPDA lesion is 90% stenosed.   LV end diastolic pressure is mildly elevated.   Hemodynamic findings consistent with mild pulmonary hypertension.   Severe 2 vessel obstructive CAD. There is a CTO of the LAD after the first diagonal with modest collaterals. The diagonal is small and trifurcates. It has 99% stenosis. There is a 90% stenosis in the proximal PDA Mildly elevated LV filling pressures. LVEDP 22 mm Hg. PCWP 21/21 mean 20 mm Hg Mild pulmonary HTN. PAP 55/23, mean 32 mm Hg Normal RA pressure No significant AV gradient  by pullback Cardiac output 4.15 L/min with index 2.02.   Plan: recommend medical therapy. Reviewing Echo there appears to be akinesis of the anterior wall and apex. The patient is having no angina and heart failure symptoms are minimal. Optimize CHF therapy. If cardiac cath is needed in the future would recommend femoral approach    Coronary Diagrams  Diagnostic Dominance: Right  Intervention      Recent Labs: 04/20/2023: B Natriuretic Peptide 973.3 04/21/2023: TSH 1.415 04/22/2023: ALT 14 06/08/2023: BUN 20; Creatinine, Ser 1.13; Hemoglobin 16.3; Platelets 203; Potassium 4.0; Sodium 142  Recent Lipid Panel    Component Value Date/Time   CHOL 167 04/21/2023 0124   TRIG 54 04/21/2023 0124   HDL 37 (L) 04/21/2023 0124   CHOLHDL 4.5 04/21/2023 0124   VLDL 11 04/21/2023 0124   LDLCALC 119 (H) 04/21/2023 0124     Risk Assessment/Calculations:                Physical Exam:    VS:  BP 130/64   Pulse 87   Ht 5\' 8"  (1.727 m)   Wt 225 lb (102.1 kg)   SpO2 94%   BMI 34.21 kg/m     Wt Readings from Last 3 Encounters:  08/29/23 225 lb (102.1 kg)  06/08/23 203 lb (92.1 kg)  05/22/23 214 lb (97.1 kg)     GEN:  Well nourished, well developed in no acute distress HEENT: Normal NECK: No JVD; No carotid  bruits LYMPHATICS: No lymphadenopathy CARDIAC: RRR, no murmurs, rubs, gallops RESPIRATORY:  Clear to auscultation without rales, wheezing or rhonchi  ABDOMEN: Soft, non-tender, non-distended MUSCULOSKELETAL:  No edema; No deformity  SKIN: Warm and dry NEUROLOGIC:  Alert and oriented x 3 PSYCHIATRIC:  Normal affect   ASSESSMENT:    1. Chronic systolic CHF (congestive heart failure) (HCC)   2. Coronary artery disease of native artery of native heart with stable angina pectoris (HCC)   3. Old myocardial infarction   4. Hyperlipidemia LDL goal <70    PLAN:    In order of problems listed above:  Chronic systolic CHF secondary to ischemic CM EF 25-30%. Appears euvolemic. On good therapy with Entresto, aldactone, Toprol  and Jardiance. Will increase Entresto to 49/51 mg bid. Continue other therapy. Low sodium diet. Will order repeat Echo CAD with old MI by cath. CTO of LAD. Other disease in PDA and first diagonal. No angina. Continue medical management Embolic CVA. Likely due to LV thrombus. On Eliquis .  HLD on statin. Labs done at [facility] HTN controlled.            Medication Adjustments/Labs and Tests Ordered: Current medicines are reviewed at length with the patient today.  Concerns regarding medicines are outlined above.  No orders of the defined types were placed in this encounter.  Meds ordered this encounter  Medications   apixaban  (ELIQUIS ) 5 MG TABS tablet    Sig: Take 1 tablet (5 mg total) by mouth 2 (two) times daily.    Dispense:  180 tablet    Refill:  3   metoprolol  succinate (TOPROL -XL) 50 MG 24 hr tablet    Sig: Take 0.5 tablets (25 mg total) by mouth daily. Take with or immediately following a meal.    Dispense:  90 tablet    Refill:  3   furosemide  (LASIX ) 40 MG tablet    Sig: Take 1 tablet (40 mg total) by mouth once a week.    Dispense:  90 tablet    Refill:  3   atorvastatin  (LIPITOR) 40 MG tablet    Sig: Take 1 tablet (40 mg total) by mouth at bedtime.     Dispense:  90 tablet    Refill:  3   sacubitril-valsartan (ENTRESTO) 49-51 MG    Sig: Take 1 tablet by mouth 2 (two) times daily.    Dispense:  180 tablet    Refill:  3    There are no Patient Instructions on file for this visit.   Signed, Belia Febo [place], MD  08/29/2023 10:31 AM    [facility]

## 2023-08-29 ENCOUNTER — Ambulatory Visit: Payer: No Typology Code available for payment source | Attending: Cardiology | Admitting: Cardiology

## 2023-08-29 ENCOUNTER — Encounter: Payer: Self-pay | Admitting: Cardiology

## 2023-08-29 VITALS — BP 130/64 | HR 87 | Ht 68.0 in | Wt 225.0 lb

## 2023-08-29 DIAGNOSIS — E785 Hyperlipidemia, unspecified: Secondary | ICD-10-CM

## 2023-08-29 DIAGNOSIS — I252 Old myocardial infarction: Secondary | ICD-10-CM

## 2023-08-29 DIAGNOSIS — I25118 Atherosclerotic heart disease of native coronary artery with other forms of angina pectoris: Secondary | ICD-10-CM | POA: Diagnosis not present

## 2023-08-29 DIAGNOSIS — I5022 Chronic systolic (congestive) heart failure: Secondary | ICD-10-CM

## 2023-08-29 MED ORDER — ENTRESTO 49-51 MG PO TABS: 1.0000 | ORAL_TABLET | Freq: Two times a day (BID) | ORAL | 3 refills | Status: AC

## 2023-08-29 MED ORDER — FUROSEMIDE 40 MG PO TABS: 40.0000 mg | ORAL_TABLET | ORAL | 3 refills | Status: AC

## 2023-08-29 MED ORDER — METOPROLOL SUCCINATE ER 50 MG PO TB24: 25.0000 mg | ORAL_TABLET | Freq: Every day | ORAL | 3 refills | Status: AC

## 2023-08-29 MED ORDER — ATORVASTATIN CALCIUM 40 MG PO TABS: 40.0000 mg | ORAL_TABLET | Freq: Every day | ORAL | 3 refills | Status: AC

## 2023-08-29 MED ORDER — APIXABAN 5 MG PO TABS: 5.0000 mg | ORAL_TABLET | Freq: Two times a day (BID) | ORAL | 3 refills | Status: AC

## 2023-08-29 NOTE — Patient Instructions (Signed)
 Medication Instructions:  Increase Entresto to 49/51 mg twice a day Continue all other medications *If you need a refill on your cardiac medications before your next appointment, please call your pharmacy*  Lab Work: None ordered  Testing/Procedures: Echo   first available   Follow-Up: At [facility], you and your health needs are our priority.  As part of our continuing mission to provide you with exceptional heart care, our providers are all part of one team.  This team includes your primary Cardiologist (physician) and Advanced Practice Providers or APPs (Physician Assistants and Nurse Practitioners) who all work together to provide you with the care you need, when you need it.  Your next appointment:  6 months   Call in July to schedule Nov appointment     Provider:  Dr.Jordan   We recommend signing up for the patient portal called "MyChart".  Sign up information is provided on this After Visit Summary.  MyChart is used to connect with patients for Virtual Visits (Telemedicine).  Patients are able to view lab/test results, encounter notes, upcoming appointments, etc.  Non-urgent messages can be sent to your provider as well.   To learn more about what you can do with MyChart, go to [email or web address].

## 2023-09-11 ENCOUNTER — Ambulatory Visit (HOSPITAL_COMMUNITY)
Admission: RE | Admit: 2023-09-11 | Discharge: 2023-09-11 | Disposition: A | Discharge status: Home / Self Care | Source: Ambulatory Visit | Attending: Cardiovascular Disease | Admitting: Cardiovascular Disease

## 2023-09-11 ENCOUNTER — Ambulatory Visit: Payer: Self-pay | Admitting: Cardiology

## 2023-09-11 DIAGNOSIS — I252 Old myocardial infarction: Secondary | ICD-10-CM | POA: Diagnosis present

## 2023-09-11 DIAGNOSIS — I5022 Chronic systolic (congestive) heart failure: Secondary | ICD-10-CM

## 2023-09-11 DIAGNOSIS — I25118 Atherosclerotic heart disease of native coronary artery with other forms of angina pectoris: Secondary | ICD-10-CM | POA: Insufficient documentation

## 2023-09-11 DIAGNOSIS — E785 Hyperlipidemia, unspecified: Secondary | ICD-10-CM | POA: Insufficient documentation

## 2023-09-11 LAB — ECHOCARDIOGRAM COMPLETE
Area-P 1/2: 4.44 cm2
S' Lateral: 4.19 cm

## 2023-09-11 MED ORDER — PERFLUTREN LIPID MICROSPHERE
1.0000 mL | INTRAVENOUS | Status: AC | PRN
  Administered 2023-09-11: 2 mL via INTRAVENOUS

## 2023-11-15 ENCOUNTER — Other Ambulatory Visit: Payer: Self-pay

## 2023-11-15 ENCOUNTER — Emergency Department (HOSPITAL_COMMUNITY)
Admission: EM | Admit: 2023-11-15 | Discharge: 2023-11-15 | Disposition: A | Discharge status: Home / Self Care | Source: Ambulatory Visit | Attending: Emergency Medicine | Admitting: Emergency Medicine

## 2023-11-15 ENCOUNTER — Emergency Department (HOSPITAL_COMMUNITY)

## 2023-11-15 DIAGNOSIS — Z7901 Long term (current) use of anticoagulants: Secondary | ICD-10-CM | POA: Insufficient documentation

## 2023-11-15 DIAGNOSIS — Z794 Long term (current) use of insulin: Secondary | ICD-10-CM | POA: Diagnosis not present

## 2023-11-15 DIAGNOSIS — Z7984 Long term (current) use of oral hypoglycemic drugs: Secondary | ICD-10-CM | POA: Insufficient documentation

## 2023-11-15 DIAGNOSIS — I10 Essential (primary) hypertension: Secondary | ICD-10-CM | POA: Diagnosis not present

## 2023-11-15 DIAGNOSIS — E119 Type 2 diabetes mellitus without complications: Secondary | ICD-10-CM | POA: Diagnosis not present

## 2023-11-15 DIAGNOSIS — Z79899 Other long term (current) drug therapy: Secondary | ICD-10-CM | POA: Insufficient documentation

## 2023-11-15 DIAGNOSIS — R42 Dizziness and giddiness: Secondary | ICD-10-CM | POA: Insufficient documentation

## 2023-11-15 LAB — PROTIME-INR
INR: 1 (ref 0.8–1.2)
Prothrombin Time: 13.7 s (ref 11.4–15.2)

## 2023-11-15 LAB — DIFFERENTIAL
Abs Immature Granulocytes: 0.01 K/uL (ref 0.00–0.07)
Basophils Absolute: 0 K/uL (ref 0.0–0.1)
Basophils Relative: 0 %
Eosinophils Absolute: 0.1 K/uL (ref 0.0–0.5)
Eosinophils Relative: 1 %
Immature Granulocytes: 0 %
Lymphocytes Relative: 34 %
Lymphs Abs: 1.8 K/uL (ref 0.7–4.0)
Monocytes Absolute: 0.5 K/uL (ref 0.1–1.0)
Monocytes Relative: 10 %
Neutro Abs: 2.9 K/uL (ref 1.7–7.7)
Neutrophils Relative %: 55 %

## 2023-11-15 LAB — I-STAT CHEM 8, ED
BUN: 23 mg/dL (ref 8–23)
Calcium, Ion: 1.24 mmol/L (ref 1.15–1.40)
Chloride: 101 mmol/L (ref 98–111)
Creatinine, Ser: 1.3 mg/dL — ABNORMAL HIGH (ref 0.61–1.24)
Glucose, Bld: 114 mg/dL — ABNORMAL HIGH (ref 70–99)
HCT: 49 % (ref 39.0–52.0)
Hemoglobin: 16.7 g/dL (ref 13.0–17.0)
Potassium: 4.3 mmol/L (ref 3.5–5.1)
Sodium: 141 mmol/L (ref 135–145)
TCO2: 29 mmol/L (ref 22–32)

## 2023-11-15 LAB — CBC
HCT: 49.7 % (ref 39.0–52.0)
Hemoglobin: 15.6 g/dL (ref 13.0–17.0)
MCH: 30.5 pg (ref 26.0–34.0)
MCHC: 31.4 g/dL (ref 30.0–36.0)
MCV: 97.1 fL (ref 80.0–100.0)
Platelets: 185 K/uL (ref 150–400)
RBC: 5.12 MIL/uL (ref 4.22–5.81)
RDW: 14.1 % (ref 11.5–15.5)
WBC: 5.3 K/uL (ref 4.0–10.5)
nRBC: 0 % (ref 0.0–0.2)

## 2023-11-15 LAB — COMPREHENSIVE METABOLIC PANEL WITH GFR
ALT: 19 U/L (ref 0–44)
AST: 23 U/L (ref 15–41)
Albumin: 3.6 g/dL (ref 3.5–5.0)
Alkaline Phosphatase: 65 U/L (ref 38–126)
Anion gap: 7 (ref 5–15)
BUN: 22 mg/dL (ref 8–23)
CO2: 29 mmol/L (ref 22–32)
Calcium: 9 mg/dL (ref 8.9–10.3)
Chloride: 101 mmol/L (ref 98–111)
Creatinine, Ser: 1.21 mg/dL (ref 0.61–1.24)
GFR, Estimated: >60 mL/min (ref >60)
Glucose, Bld: 118 mg/dL — ABNORMAL HIGH (ref 70–99)
Potassium: 4 mmol/L (ref 3.5–5.1)
Sodium: 137 mmol/L (ref 135–145)
Total Bilirubin: 0.6 mg/dL (ref 0.0–1.2)
Total Protein: 7 g/dL (ref 6.5–8.1)

## 2023-11-15 LAB — ETHANOL: Alcohol, Ethyl (B): <15 mg/dL (ref <15)

## 2023-11-15 LAB — APTT: aPTT: 30 s (ref 24–36)

## 2023-11-15 MED ORDER — SODIUM CHLORIDE 0.9 % IV BOLUS
500.0000 mL | Freq: Once | INTRAVENOUS | Status: AC
  Administered 2023-11-15: 500 mL via INTRAVENOUS

## 2023-11-15 MED ORDER — MECLIZINE HCL 25 MG PO TABS
25.0000 mg | ORAL_TABLET | Freq: Three times a day (TID) | ORAL | 0 refills | Status: AC | PRN
Start: 2023-11-15 — End: ?

## 2023-11-15 MED ORDER — MECLIZINE HCL 25 MG PO TABS
12.5000 mg | ORAL_TABLET | Freq: Once | ORAL | Status: AC
  Administered 2023-11-15: 12.5 mg via ORAL
  Filled 2023-11-15: qty 1

## 2023-11-15 NOTE — ED Provider Notes (Signed)
 [place] EMERGENCY DEPARTMENT AT [facility] Provider Note   CSN: [id number] Arrival date & time: 11/15/23  1357     Patient presents with: Dizziness   Kevin Flynn is a 78 y.o. male.   Pt is a 78 yo male with pmhx significant for CVA with right sided weakness, Depression, HTN, DM, PTSD, HLD, hx intracranial aneurysm, and anticoagulation on Eliquis .  Pt presents to the ED today with dizziness.  He mentioned this to the [facility] when he went today for rehab.  They did a CT which recommended MRI.  Pt said sx have been going on for about 2 weeks.  He denies any new weakness, difficulty speaking or swallowing.  No pain.  No falls.       Prior to Admission medications   Medication Sig Start Date End Date Taking? Authorizing Provider  apixaban  (ELIQUIS ) 5 MG TABS tablet Take 1 tablet (5 mg total) by mouth 2 (two) times daily. 08/29/23 08/23/24  [place], Peter M, MD  atorvastatin  (LIPITOR) 40 MG tablet Take 1 tablet (40 mg total) by mouth at bedtime. 08/29/23   [place], Peter M, MD  clonazePAM (KLONOPIN) 0.5 MG tablet Take by mouth daily. 08/03/23   [provider]  empagliflozin (JARDIANCE) 25 MG TABS tablet Take 12.5 mg by mouth every morning. 05/24/23   [provider]  furosemide  (LASIX ) 40 MG tablet Take 1 tablet (40 mg total) by mouth once a week. 08/29/23   [place], Peter M, MD  insulin  glargine-yfgn (SEMGLEE) 100 UNIT/ML injection Inject 17 Units into the skin at bedtime. 09/30/20   [provider]  metoprolol  succinate (TOPROL -XL) 50 MG 24 hr tablet Take 0.5 tablets (25 mg total) by mouth daily. Take with or immediately following a meal. 08/29/23   [place], Peter M, MD  Multiple Vitamins-Minerals (MULTIVITAMIN WITH MINERALS) tablet Take 1 tablet by mouth daily.    [provider]  potassium chloride  SA (KLOR-CON  M) 20 MEQ tablet Take 2 tablets (40 mEq total) by mouth once a week. 05/22/23   Lucien Orren SAILOR, PA-C  PRESCRIPTION MEDICATION Pt has CPAP  Machine    [provider]  sacubitril-valsartan (ENTRESTO ) 49-51 MG Take 1 tablet by mouth 2 (two) times daily. 08/29/23   [place], Peter M, MD  spironolactone (ALDACTONE) 25 MG tablet Take 25 mg by mouth daily. 06/20/23   [provider]    Allergies: Patient has no known allergies.    Review of Systems  Neurological:  Positive for dizziness.  All other systems reviewed and are negative.   Updated Vital Signs BP (!) 148/87   Pulse 74   Temp 97.6 F (36.4 C) (Oral)   Resp 13   Ht 5' 8 (1.727 m)   Wt 105.7 kg   SpO2 100%   BMI 35.43 kg/m   Physical Exam Vitals and nursing note reviewed.  Constitutional:      Appearance: Normal appearance.  HENT:     Head: Normocephalic and atraumatic.     Right Ear: External ear normal.     Left Ear: External ear normal.     Nose: Nose normal.     Mouth/Throat:     Mouth: Mucous membranes are moist.     Pharynx: Oropharynx is clear.  Eyes:     Extraocular Movements: Extraocular movements intact.     Conjunctiva/sclera: Conjunctivae normal.     Pupils: Pupils are equal, round, and reactive to light.  Cardiovascular:     Rate and Rhythm: Normal rate and  regular rhythm.     Pulses: Normal pulses.     Heart sounds: Normal heart sounds.  Pulmonary:     Effort: Pulmonary effort is normal.     Breath sounds: Normal breath sounds.  Abdominal:     General: Abdomen is flat. Bowel sounds are normal.     Palpations: Abdomen is soft.  Musculoskeletal:        General: Normal range of motion.     Cervical back: Normal range of motion and neck supple.  Skin:    General: Skin is warm.     Capillary Refill: Capillary refill takes less than 2 seconds.  Neurological:     Mental Status: He is alert and oriented to person, place, and time. Mental status is at baseline.     Comments: Mild right hand weakness (chronic)  Psychiatric:        Mood and Affect: Mood normal.        Behavior: Behavior normal.     (all labs ordered  are listed, but only abnormal results are displayed) Labs Reviewed  COMPREHENSIVE METABOLIC PANEL WITH GFR - Abnormal; Notable for the following components:      Result Value   Glucose, Bld 118 (*)    All other components within normal limits  I-STAT CHEM 8, ED - Abnormal; Notable for the following components:   Creatinine, Ser 1.30 (*)    Glucose, Bld 114 (*)    All other components within normal limits  PROTIME-INR  APTT  CBC  DIFFERENTIAL  ETHANOL  CBG MONITORING, ED    EKG: None  Radiology: No results found.   Procedures   Medications Ordered in the ED  sodium chloride  0.9 % bolus 500 mL (500 mLs Intravenous New Bag/Given 11/15/23 1531)  meclizine  (ANTIVERT ) tablet 12.5 mg (12.5 mg Oral Given 11/15/23 1534)                                    Medical Decision Making Amount and/or Complexity of Data Reviewed Labs: ordered. Radiology: ordered.   This patient presents to the ED for concern of dizziness, this involves an extensive number of treatment options, and is a complaint that carries with it a high risk of complications and morbidity.  The differential diagnosis includes vertigo, anemia, infection, CVA   Co morbidities that complicate the patient evaluation  CVA with right sided weakness, Depression, HTN, DM, PTSD, HLD, hx intracranial aneurysm, and anticoagulation on Eliquis    Additional history obtained:  Additional history obtained from epic chart review  Lab Tests:  I Ordered, and personally interpreted labs.  The pertinent results include:  cbc nl, cmp nl, etoh neg; inr nl   Imaging Studies ordered:  I ordered imaging studies including mri  I independently visualized and interpreted imaging which showed  No acute intracranial abnormality.  2. Moderate amount of age-related cerebral volume loss.  3. 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  4. Chronic lacunar infarct  within the right pons.   I agree with the radiologist interpretation   Cardiac Monitoring:  The patient was maintained on a cardiac monitor.  I personally viewed and interpreted the cardiac monitored which showed an underlying rhythm of: nsr   Medicines ordered and prescription drug management:  I ordered medication including antivert   for sx  Reevaluation of the patient after these medicines showed that the patient improved I  have reviewed the patients home medicines and have made adjustments as needed   Test Considered:  mri   Problem List / ED Course:  Dizziness:  likely vertigo.  MRI without any new CVA.  Pt is stable for d/c.  Return if worse.  F/u with pcp.   Reevaluation:  After the interventions noted above, I reevaluated the patient and found that they have :improved   Social Determinants of Health:  Lives at home   Dispostion:  After consideration of the diagnostic results and the patients response to treatment, I feel that the patent would benefit from discharge with outpatient f/u.       Final diagnoses:  None    ED Discharge Orders     None          Dean Clarity, MD 11/15/23 [phone]

## 2023-11-15 NOTE — ED Triage Notes (Signed)
 Pt referred from [facility] for further workup. Was there for rehab of right hand which has deficits from prior mini strokes. Had CT scan, report recommends MRI. Pt reports dizziness the last several days esp when waking up for getting up. No slurring, no falls. Denies pain

## 2023-12-15 ENCOUNTER — Encounter: Payer: Self-pay | Admitting: Physical Medicine & Rehabilitation

## 2024-01-18 ENCOUNTER — Encounter: Attending: Physical Medicine & Rehabilitation | Admitting: Physical Medicine & Rehabilitation

## 2024-01-18 ENCOUNTER — Encounter: Payer: Self-pay | Admitting: Physical Medicine & Rehabilitation

## 2024-01-18 VITALS — BP 132/83 | HR 74 | Ht 68.0 in | Wt 242.2 lb

## 2024-01-18 DIAGNOSIS — R42 Dizziness and giddiness: Secondary | ICD-10-CM | POA: Diagnosis not present

## 2024-01-18 DIAGNOSIS — G250 Essential tremor: Secondary | ICD-10-CM | POA: Insufficient documentation

## 2024-01-18 DIAGNOSIS — R2689 Other abnormalities of gait and mobility: Secondary | ICD-10-CM | POA: Diagnosis not present

## 2024-01-18 MED ORDER — GABAPENTIN 100 MG PO CAPS: 100.0000 mg | ORAL_CAPSULE | Freq: Three times a day (TID) | ORAL | 2 refills | Status: AC

## 2024-01-18 NOTE — Progress Notes (Signed)
 Subjective:    Patient ID: Kevin Flynn, male    DOB: May 08, 1945, 78 y.o.   MRN: [id number]  HPI    Chief Complaint: Follow-up for stroke.  History of Present Illness: Attended clinic for follow-up post-stroke. Patient and caregiver say they received a letter to schedule an appointment.  Stroke occurred in January of this year. Hospitalized at [facility] and discharged directly home without inpatient rehabilitation. Pt had CVA posterior left frontal cortex, felt to be cardioembolic.   Symptom Review: - Vertigo: Began around May, diagnosed in June/July. Describes a spinning sensation, particularly with rapid changes in position, such as standing up quickly or turning the head. Also experiences lightheadedness. Occurs nearly all the time now. Not present when lying down. - Right Upper Extremity: Numbness, weakness, and tremor in the right arm and hand. These symptoms were present before the stroke but worsened significantly after. The tremor has been present for approximately 15 years. Weakness and tremor cause functional impairment, including difficulty with fine motor tasks like signing his name. - Right Lower Extremity: Drags the right foot when walking. This was also noted prior to the stroke. Reports tingling in the right foot only. - Gait/Mobility: Walks slowly. Vertigo affects balance. Does not use an assistive device. - Pain: Denies pain.  Past Medical History: - Cerebrovascular accident (CVA), January 2025 - Essential tremor - Diabetes mellitus (reportedly loves sweets, advised to reduce sugary intake by caregiver) - Congestive heart failure - History of knee surgery  Medications: - On meclizine  for vertigo. - On metoprolol .   Allergies: Not discussed.  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 - Lives with wife  - Formerly active with yard work but is now limited by dyspnea on exertion and vertigo.   Pain Inventory Average Pain 0 Pain Right  Now 0 My pain is intermittent and tingling  LOCATION OF PAIN  right arm, bilateral feet  BOWEL Number of stools per week: 7 Oral laxative use No  Type of laxative No Enema or suppository use No  History of colostomy No  Incontinent No   BLADDER Normal In and out cath, frequency N/A Able to self cath N/A Bladder incontinence No  Frequent urination Yes  Leakage with coughing No  Difficulty starting stream No  Incomplete bladder emptying No    Mobility walk with assistance how many minutes can you walk? 60 or less ability to climb steps?  no do you drive?  no  Function disabled: date disabled 2004  Neuro/Psych weakness tremor tingling trouble walking spasms dizziness  Prior Studies Any changes since last visit?  no  Physicians involved in [facility] care [facility] (unsure of name)   No family history on file. Social History   Socioeconomic History   Marital status: Married    Spouse name: Not on file   Number of children: Not on file   Years of education: Not on file   Highest education level: Not on file  Occupational History   Not on file  Tobacco Use   Smoking status: Former   Smokeless tobacco: Never  Substance and Sexual Activity   Alcohol use: Yes    Comment: occasionally   Drug use: No   Sexual activity: Not on file  Other Topics Concern   Not on file  Social History Narrative   Not on file   Social Drivers of Health   Financial Resource Strain: Not on file  Food Insecurity: No Food Insecurity (  04/20/2023)   Hunger Vital Sign    Worried About Running Out of Food in the Last Year: Never true    Ran Out of Food in the Last Year: Never true  Transportation Needs: No Transportation Needs (04/20/2023)   PRAPARE - Administrator, Civil Service (Medical): No    Lack of Transportation (Non-Medical): No  Physical Activity: Not on file  Stress: Not on file  Social Connections: Socially  Integrated (04/20/2023)   Social Connection and Isolation Panel    Frequency of Communication with Friends and Family: More than three times a week    Frequency of Social Gatherings with Friends and Family: More than three times a week    Attends Religious Services: More than 4 times per year    Active Member of Clubs or Organizations: Yes    Attends Engineer, structural: More than 4 times per year    Marital Status: Married   Past Surgical History:  Procedure Laterality Date   KNEE SURGERY Left tendon repair   RIGHT/LEFT HEART CATH AND CORONARY ANGIOGRAPHY N/A 06/08/2023   Procedure: RIGHT/LEFT HEART CATH AND CORONARY ANGIOGRAPHY;  Surgeon: [place], Peter M, MD;  Location: [facility] INVASIVE CV LAB;  Service: Cardiovascular;  Laterality: N/A;   Past Medical History:  Diagnosis Date   Depression    Diabetes mellitus without complication (HCC)    Hypertension    BP 132/83 (BP Location: Left Arm, Patient Position: Sitting, Cuff Size: Large)   Pulse 74   Ht 5' 8 (1.727 m)   Wt 242 lb 3.2 oz (109.9 kg)   SpO2 94%   BMI 36.83 kg/m   Opioid Risk Score:   Fall Risk Score:  `1  Depression screen PHQ 2/9     01/18/2024    2:21 PM  Depression screen PHQ 2/9  Decreased Interest 0  Down, Depressed, Hopeless 0  PHQ - 2 Score 0  Altered sleeping 0  Tired, decreased energy 1  Change in appetite 0  Feeling bad or failure about yourself  0  Trouble concentrating 0  Moving slowly or fidgety/restless 3  Suicidal thoughts 0  PHQ-9 Score 4  Difficult doing work/chores Not difficult at all      Review of Systems  HENT:         Vertigo  Respiratory:  Positive for apnea.        OSA with CPAP  Cardiovascular:        Hypertension, CHF  Endocrine:       DM II  Genitourinary:  Positive for urgency.       Urgency and frequency to void  Neurological:  Positive for dizziness, tremors and weakness.       Right arm tingling, tremors, weakness, spasms, confusion, vertigo   Psychiatric/Behavioral:  Positive for confusion.   All other systems reviewed and are negative.      Objective:   Physical Exam  Gen: no distress, normal appearing HEENT: oral mucosa pink and moist, NCAT Chest: normal effort, normal rate of breathing Abd: soft, non-distended Ext: no edema Psych: pleasant, normal affect Skin: intact Neuro: Alert and awake, follows commands,  RUE: 5/5 Deltoid, 5/5 Biceps, 5/5 Triceps, 4+/5 Wrist Ext, 4+/5 Grip LUE: 5/5 Deltoid, 5/5 Biceps, 5/5 Triceps, 5/5 Wrist Ext, 5/5 Grip RLE: HF 4+/5, KE 4+/5, ADF 4+/5, APF 4+/5 LLE: HF 5/5, KE 5/5, ADF 5/5, APF 5/5 Sensory exam normal for light touch and pain in all 4 limbs.    - Cranial Nerves:   -  EOMI without nystagmus or reported diplopia.   - Visual fields appear intact to confrontation.   - Facial movements symmetric. Tongue midline.   - Shoulder shrug symmetric. - Motor:   - Strength: Good grip strength bilaterally. Overall, muscles appear to be working. Hip flexion and knee extension may be slightly weaker on the right compared to the left. Good strength with ankle plantarflexion and dorsiflexion bilaterally.   - Tone: Normal.   - Tremor: Action tremor noted bilaterally, more pronounced on the right. Evident on finger-to-nose testing. - Sensation: Reports sensation is normal to light touch in all four extremities. - Reflexes: Hyporeflexive b/l LE. Hoffman's sign is negative. - Tremor b/l UE R>L with activity  - FTN intact b/l - Gait: Ambulates without an assistive device. Gait is slow with a wide base of support.  Musculoskeletal: no joint swelling noted    Assessment & Plan:   ASSESSMENT  1.  History of CVA (January 2025) with residual mild right-sided weakness, numbness, and tremor. Weakness and tremor pre-dated the stroke but worsened afterward. Functional limitations due to these deficits. 2.  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 Significant functional impact. 3.   Essential Tremor, longstanding, affecting both upper extremities, more severe on the right. Contributes significantly to functional difficulty with the right hand. 4.  Gait Impairment, secondary to weakness, vertigo, and deconditioning.  PLAN  1.  Vestibular Therapy: [facility] and treatment of [facility]igo. Sent referral.   2.  Essential Tremor Management:     - Will prescribe Gabapentin. Discussed starting with one dose at night and titrating up to three times a day as tolerated. Discussed potential side effects and that he can stop it if it makes him feel unwell.     - Noted that primidone can worsen vertigo will avoid for now  3.  Therapies: Continues to attend outpatient physical therapy at the [facility] for strengthening.

## 2024-01-23 ENCOUNTER — Encounter (INDEPENDENT_AMBULATORY_CARE_PROVIDER_SITE_OTHER): Admitting: Ophthalmology

## 2024-01-25 ENCOUNTER — Ambulatory Visit: Attending: Physical Medicine & Rehabilitation

## 2024-01-25 VITALS — BP 106/65 | HR 77

## 2024-01-25 DIAGNOSIS — R42 Dizziness and giddiness: Secondary | ICD-10-CM | POA: Insufficient documentation

## 2024-01-25 DIAGNOSIS — R2681 Unsteadiness on feet: Secondary | ICD-10-CM | POA: Diagnosis present

## 2024-01-25 NOTE — Progress Notes (Signed)
 [facility] - Clinic Note  02/06/2024   CHIEF COMPLAINT Patient presents for Retina Evaluation  HISTORY OF PRESENT ILLNESS: Kevin Flynn is a 78 y.o. male who presents to the clinic today for:  HPI     Retina Evaluation   In both eyes.  This started 15 years ago.  Duration of 15 years.  Associated Symptoms Flashes.  Negative for Floaters, Distortion, Blind Spot, Pain, Redness, Photophobia, Glare, Trauma, Scalp Tenderness, Jaw Claudication, Shoulder/Hip pain, Fever, Weight Loss and Fatigue.  I, the attending physician,  performed the HPI with the patient and updated documentation appropriately.        Comments   Referral from the [facility], DM exam. Pt states he has been diabetic for the past 15 years. Pt is on 17 units of insulin  a day. Pt states vision has been about the same. Pt states he will notice a flash in his peripheral OU that looks like someone tore a piece of paper, he notices this at least once per week. Pt denies any floaters/pain. Pt does not use ats. BS=not monitored  A1c=6.0 about 6 months ago Pt states he had a stroke in January and started having vertigo in August.       Last edited by Valdemar Rogue, MD on 02/11/2024 10:15 AM.     Pt states he was referred by the [facility]. Sees a reg eye doc at the [facility] in [place]. No issues w/ vision reported.   [facility] physician: [facility] [address] [place],  [place] [address]  HISTORICAL INFORMATION:  Selected notes from the MEDICAL RECORD NUMBER [facility] LEE:  Ocular Hx- PMH-   CURRENT MEDICATIONS: No current outpatient medications on file. (Ophthalmic Drugs)   No current facility-administered medications for this visit. (Ophthalmic Drugs)   Current Outpatient Medications (Other)  Medication Sig   apixaban  (ELIQUIS ) 5 MG TABS tablet Take 1 tablet (5 mg total) by mouth 2 (two) times daily.   atorvastatin  (LIPITOR) 40 MG tablet Take 1 tablet (40 mg total) by  mouth at bedtime.   clonazePAM (KLONOPIN) 0.5 MG tablet Take by mouth daily.   empagliflozin (JARDIANCE) 25 MG TABS tablet Take 12.5 mg by mouth every morning.   furosemide  (LASIX ) 40 MG tablet Take 1 tablet (40 mg total) by mouth once a week.   gabapentin (NEURONTIN) 100 MG capsule Take 1 capsule (100 mg total) by mouth 3 (three) times daily.   insulin  glargine-yfgn (SEMGLEE) 100 UNIT/ML injection Inject 17 Units into the skin at bedtime.   meclizine  (ANTIVERT ) 25 MG tablet Take 1 tablet (25 mg total) by mouth 3 (three) times daily as needed for dizziness.   metoprolol  succinate (TOPROL -XL) 50 MG 24 hr tablet Take 0.5 tablets (25 mg total) by mouth daily. Take with or immediately following a meal.   Multiple Vitamins-Minerals (MULTIVITAMIN WITH MINERALS) tablet Take 1 tablet by mouth daily.   potassium chloride  SA (KLOR-CON  M) 20 MEQ tablet Take 2 tablets (40 mEq total) by mouth once a week.   PRESCRIPTION MEDICATION Pt has CPAP Machine   sacubitril-valsartan (ENTRESTO ) 49-51 MG Take 1 tablet by mouth 2 (two) times daily.   spironolactone (ALDACTONE) 25 MG tablet Take 25 mg by mouth daily.   No current facility-administered medications for this visit. (Other)   REVIEW OF SYSTEMS: ROS   Positive for: Endocrine, Cardiovascular Negative for: Constitutional, Gastrointestinal, Neurological, Skin, Genitourinary, Musculoskeletal, HENT, Eyes, Respiratory, Psychiatric, Allergic/Imm, Heme/Lymph Last edited by Elnor Avelina RAMAN,  COT on 02/06/2024  1:51 PM.     ALLERGIES No Known Allergies PAST MEDICAL HISTORY Past Medical History:  Diagnosis Date   Depression    Diabetes mellitus without complication (HCC)    Hypertension    Stroke [facility])    Past Surgical History:  Procedure Laterality Date   KNEE SURGERY Left tendon repair   RIGHT/LEFT HEART CATH AND CORONARY ANGIOGRAPHY N/A 06/08/2023   Procedure: RIGHT/LEFT HEART CATH AND CORONARY ANGIOGRAPHY;  Surgeon: Jordan, Peter M, MD;  Location: [facility]  INVASIVE CV LAB;  Service: Cardiovascular;  Laterality: N/A;   FAMILY HISTORY History reviewed. No pertinent family history. SOCIAL HISTORY Social History   Tobacco Use   Smoking status: Former   Smokeless tobacco: Never  Substance Use Topics   Alcohol use: Yes    Comment: occasionally   Drug use: No       OPHTHALMIC EXAM:  Base Eye Exam     Visual Acuity (Snellen - Linear)       Right Left   Dist [place] 20/40 20/50 -2   Dist ph [place] 20/25 +1 20/30 -3         Tonometry (Tonopen, 1:40 PM)       Right Left   Pressure 18 20         Pupils       Pupils Dark Light Shape React APD   Right PERRL 2 1 Round Minimal None   Left PERRL 2 1 Round Minimal None         Visual Fields       Left Right    Full Full         Extraocular Movement       Right Left    Full, Ortho Full, Ortho         Neuro/Psych     Oriented x3: Yes         Dilation     Both eyes: 1.0% Mydriacyl, 2.5% Phenylephrine @ 1:41 PM           Slit Lamp and Fundus Exam     Slit Lamp Exam       Right Left   Lids/Lashes Dermatochalasis Dermatochalasis   Conjunctiva/Sclera mild melanosis mild melanosis   Cornea arcus, 1+ PEE arcus, 1+ PEE   Anterior Chamber Deep and Clear Deep and Clear   Iris Round and moderately dilated, no NVI Round and Dilated, no NVI   Lens 2-3+ Nuclear sclerosis, 2-3+ Cortical cataract 2-3+ Nuclear sclerosis, 2-3+ Cortical cataract   Anterior Vitreous Vitreous syneresis Vitreous syneresis         Fundus Exam       Right Left   Disc Pink and Sharp, temporal PPP Pink and Sharp, mild temporal PPP   C/D Ratio 0.6 0.6   Macula Flat, Good foveal reflex, RPE mottling, No heme or edema Flat, Good foveal reflex, RPE mottling, No heme or edema   Vessels Attenuated, mild copper wiring, milld AV crossing changes Attenuated, mild copper wiring, milld AV crossing changes   Periphery Attached, No heme Attached, No heme           Refraction     Manifest  Refraction (Subjective)       Sphere Cylinder Axis Dist [facility]   Right -0.50 +1.00 006 20-1   Left +0.75 +1.25 155 20-1         Manifest Refraction #2 (Auto)       Sphere Cylinder Axis Dist VA   Right -0.50 +1.00 177  Left +0.75 +1.25 155            IMAGING AND PROCEDURES  Imaging and Procedures for 02/06/2024  OCT, Retina - OU - Both Eyes       Right Eye Quality was poor. Central Foveal Thickness: 243. Progression has no prior data. Findings include normal foveal contour, no IRF, no SRF.   Left Eye Quality was good. Central Foveal Thickness: 237. Progression has no prior data. Findings include normal foveal contour, no IRF, no SRF.   Notes *Images captured and stored on drive  Diagnosis / Impression:  NFP, no IRF/SRF OU No DME OU  Clinical management:  See below  Abbreviations: NFP - Normal foveal profile. CME - cystoid macular edema. PED - pigment epithelial detachment. IRF - intraretinal fluid. SRF - subretinal fluid. EZ - ellipsoid zone. ERM - epiretinal membrane. ORA - outer retinal atrophy. ORT - outer retinal tubulation. SRHM - subretinal hyper-reflective material. IRHM - intraretinal hyper-reflective material           ASSESSMENT/PLAN:   ICD-10-CM   1. Diabetes mellitus type 2 without retinopathy (HCC)  E11.9 OCT, Retina - OU - Both Eyes    2. Diabetes mellitus treated with insulin  and oral medication (HCC)  E11.9    Z79.4    Z79.84     3. Essential hypertension  I10     4. Hypertensive retinopathy of both eyes  H35.033 OCT, Retina - OU - Both Eyes    5. Combined forms of age-related cataract of both eyes  H25.813      1,2. Diabetes mellitus, type 2 without retinopathy  - A1C 5.6 on 01.09.25, diagnosed 15 years ago - Pt taking Semglee (insulin ) and Jardiance  - No DME OU - The incidence, risk factors for progression, natural history and treatment options for diabetic retinopathy  were discussed with patient.   - The need for close monitoring  of blood glucose, blood pressure, and serum lipids, avoiding cigarette or any type of tobacco, and the need for long term follow up was also discussed with patient. - f/u in 1 year, sooner prn   3,4. Hypertensive retinopathy OU - discussed importance of tight BP control - monitor   5. Mixed Cataract OU - The symptoms of cataract, surgical options, and treatments and risks were discussed with patient. - discussed diagnosis and progression - monitor   Ophthalmic Meds Ordered this visit:  No orders of the defined types were placed in this encounter.    Return in about 1 year (around 02/05/2025) for DM exam, DFE, OCT.  There are no Patient Instructions on file for this visit.  Explained the diagnoses, plan, and follow up with the patient and they expressed understanding.  Patient expressed understanding of the importance of proper follow up care.   This document serves as a record of services personally performed by Redell JUDITHANN Hans, MD, PhD. It was created on their behalf by Wanda GEANNIE Keens, COT an ophthalmic technician. The creation of this record is the provider's dictation and/or activities during the visit.    Electronically signed by:  Wanda GEANNIE Keens, COT  02/11/24 10:17 AM  This document serves as a record of services personally performed by Redell JUDITHANN Hans, MD, PhD. It was created on their behalf by Almetta Pesa, an ophthalmic technician. The creation of this record is the provider's dictation and/or activities during the visit.    Electronically signed by: Almetta Pesa, OA, 02/11/24  10:17 AM   Redell JUDITHANN Hans, M.D.,  Ph.D. Diseases & Surgery of the Retina and Vitreous [facility] 02/06/2024  I have reviewed the above documentation for accuracy and completeness, and I agree with the above. Redell JUDITHANN Hans, M.D., Ph.D. 02/11/24 10:18 AM   Abbreviations: M myopia (nearsighted); A astigmatism; H hyperopia (farsighted); P presbyopia; Mrx  spectacle prescription;  CTL contact lenses; OD right eye; OS left eye; OU both eyes  XT exotropia; ET esotropia; PEK punctate epithelial keratitis; PEE punctate epithelial erosions; DES dry eye syndrome; MGD meibomian gland dysfunction; ATs artificial tears; PFAT's preservative free artificial tears; NSC nuclear sclerotic cataract; PSC posterior subcapsular cataract; ERM epi-retinal membrane; PVD posterior vitreous detachment; RD retinal detachment; DM diabetes mellitus; DR diabetic retinopathy; NPDR non-proliferative diabetic retinopathy; PDR proliferative diabetic retinopathy; CSME clinically significant macular edema; DME diabetic macular edema; dbh dot blot hemorrhages; CWS cotton wool spot; POAG primary open angle glaucoma; C/D cup-to-disc ratio; HVF humphrey visual field; GVF goldmann visual field; OCT optical coherence tomography; IOP intraocular pressure; BRVO Branch retinal vein occlusion; CRVO central retinal vein occlusion; CRAO central retinal artery occlusion; BRAO branch retinal artery occlusion; RT retinal tear; SB scleral buckle; PPV pars plana vitrectomy; VH Vitreous hemorrhage; PRP panretinal laser photocoagulation; IVK intravitreal kenalog; VMT vitreomacular traction; MH Macular hole;  NVD neovascularization of the disc; NVE neovascularization elsewhere; AREDS age related eye disease study; ARMD age related macular degeneration; POAG primary open angle glaucoma; EBMD epithelial/anterior basement membrane dystrophy; ACIOL anterior chamber intraocular lens; IOL intraocular lens; PCIOL posterior chamber intraocular lens; Phaco/IOL phacoemulsification with intraocular lens placement; PRK photorefractive keratectomy; LASIK laser assisted in situ keratomileusis; HTN hypertension; DM diabetes mellitus; COPD chronic obstructive pulmonary disease

## 2024-01-25 NOTE — Therapy (Signed)
 OUTPATIENT PHYSICAL THERAPY VESTIBULAR EVALUATION     Patient Name: Kevin Flynn MRN: [id number] DOB:October 19, 1945, 78 y.o., male Today's Date: 01/25/2024  END OF SESSION:  PT End of Session - 01/25/24 0921     Visit Number 1    Number of Visits 9    Date for Recertification  02/23/24    Authorization Type [employer]- medicare    PT Start Time 0930    PT Stop Time 1010    PT Time Calculation (min) 40 min    Activity Tolerance Patient tolerated treatment well    Behavior During Therapy WFL for tasks assessed/performed          Past Medical History:  Diagnosis Date   Depression    Diabetes mellitus without complication (HCC)    Hypertension    Past Surgical History:  Procedure Laterality Date   KNEE SURGERY Left tendon repair   RIGHT/LEFT HEART CATH AND CORONARY ANGIOGRAPHY N/A 06/08/2023   Procedure: RIGHT/LEFT HEART CATH AND CORONARY ANGIOGRAPHY;  Surgeon: [place], Peter M, MD;  Location: [facility] INVASIVE CV LAB;  Service: Cardiovascular;  Laterality: N/A;   Patient Active Problem List   Diagnosis Date Noted   Cardiomyopathy (HCC) 04/21/2023   HFrEF (heart failure with reduced ejection fraction) (HCC) 04/21/2023   Diabetes mellitus (HCC) 04/21/2023   Former smoker 04/21/2023   Sleep apnea in adult 04/21/2023   Mixed hyperlipidemia 04/21/2023   Acute CVA (cerebrovascular accident) (HCC) 04/21/2023   Stroke (cerebrum) (HCC) 04/20/2023   Leukopenia 04/20/2023   Pleural effusion 04/20/2023    [facility]  REFERRING PROVIDER: Murray Collier, MD  REFERRING DIAG: R42 (ICD-10-CM) - Vertigo   THERAPY DIAG:  Unsteadiness on feet - Plan: PT plan of care cert/re-cert  Dizziness and giddiness - Plan: PT plan of care cert/re-cert  ONSET DATE: 01/18/24 referral   Rationale for Evaluation and Treatment: Rehabilitation  SUBJECTIVE:   SUBJECTIVE STATEMENT: Patient arrives to clinic with wife, who stays in lobby. Reports that Vertigo started in August. Sitting in front  of PT, he feels as though the room is spinning. He does not have a known/documented h/o vertigo either. He ambulates without an AD. Denies any recent medication changes. Has a h/o essential tremor, but reports that this is worse since onset of vertigo in August.  Pt accompanied by: significant other stayed in lobby  PERTINENT HISTORY: depression, DM, HTN, CVA jan 2025  PAIN:  Are you having pain? No  PRECAUTIONS: Fall  RED FLAGS: None   WEIGHT BEARING RESTRICTIONS: No  FALLS: Has patient fallen in last 6 months? No Denies near falls  LIVING ENVIRONMENT: Lives with: lives with their family Lives in: House/apartment Stairs: Yes: External: 3 steps; can reach both Has following equipment at home: Single point cane and Grab bars  PLOF: Independent short distance driving   PATIENT GOALS: to help with the dizziness   OBJECTIVE:  Note: Objective measures were completed at Evaluation unless otherwise noted.  DIAGNOSTIC FINDINGS:  11/15/23 brain MRI IMPRESSION: 1. No acute intracranial abnormality. 2. Moderate amount of age-related cerebral volume loss. 3. 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 4. Chronic lacunar infarct within the right pons.  04/20/23 CT angio IMPRESSION: 1. Evaluation is limited by bolus timing, despite multiple attempts to time the contrast. Given the slow is with which the contrast passed from the pulmonary arteries to the aorta, a cardiac etiology is suspected. 2. The intracranial and extracranial vertebral arteries are not sufficiently opacified  for adequate evaluation. The PCAs are primarily supplied by the posterior communicating arteries. Opacification of the distal basilar artery may be retrograde. 3. Moderate stenosis in the distal left M1. Mild stenosis right cavernous and supraclinoid ICA. 4. Severe stenosis in the mid right P2 and proximal left P2. 5. 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  04/20/23 brain MRI IMPRESSION: 1. Acute and subacute infarcts in the posterior left frontal cortex, which includes the motor strip. 2. Loss of the left vertebral artery flow void in the V2, V3, and V4 segments, which is concerning for occlusion or slow flow. This could be further evaluated with a CTA of the head and neck. 3. C3-C4 moderate right neural foraminal narrowing. 4. C6-C7 mild left neural foraminal narrowing. 5. No spinal canal stenosis.  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  STRENGTH: WFL   BED MOBILITY:  Independent, denies changes/increases in dizziness   GAIT: Gait pattern: step through pattern, decreased arm swing- Right, decreased arm swing- Left, and wide BOS   VESTIBULAR ASSESSMENT:  GENERAL OBSERVATION: NAD, no AD   SYMPTOM BEHAVIOR:  Subjective history: see above  Non-Vestibular symptoms: changes in vision blurred  Type of dizziness: Blurred Vision and Spinning/Vertigo  Frequency: 90% of the day   Duration: 90% of the day (only not noticeable when sleeping)   Aggravating factors: Spontaneous and Induced by motion: turning body quickly and turning head quickly  Relieving factors: no known relieving factors  Progression of symptoms: unchanged  OCULOMOTOR EXAM:  Ocular Alignment: normal  Ocular ROM: No Limitations  Spontaneous Nystagmus: absent  Gaze-Induced Nystagmus: right beating with right gaze  Smooth Pursuits: saccades  Saccades: hypometric/undershoots to the R  Convergence/Divergence: ~10 cm   Cover-cross-cover test: Deviations: L eye cover/uncover: medial inferior correction    VESTIBULAR - OCULAR REFLEX:   Slow VOR: Normal  VOR  Cancellation: Corrective Saccades  Head-Impulse Test: HIT Right: positive HIT Left: positive  Dynamic Visual Acuity: TBA   POSITIONAL TESTING: Right Roll Test: pure downbeat  Left Roll Test: no nystagmus  MOTION SENSITIVITY:  Motion Sensitivity Quotient Intensity: 0 = none, 1 = Lightheaded, 2 = Mild, 3 = Moderate, 4 = Severe, 5 = Vomiting  Intensity  1. Sitting to supine 3  2. Supine to L side 3  3. Supine to R side 3  4. Supine to sitting 3  5. L Hallpike-Dix   6. Up from L    7. R Hallpike-Dix   8. Up from R    9. Sitting, head tipped to L knee   10. Head up from L knee   11. Sitting, head tipped to R knee   12. Head up from R knee   13. Sitting head turns x5   14.Sitting head nods x5   15. In stance, 180 turn to L    16. In stance, 180 turn to R     Vitals:   01/25/24 0948  BP: 106/65  Pulse: 77  TREATMENT Self care/home management: -review of imaging as it pertains to sx  -PT role in central vertigo    PATIENT EDUCATION: Education details: PT POC, exam findings, see above Person educated: Patient Education method: Explanation and Handouts Education comprehension: verbalized understanding and needs further education  HOME EXERCISE PROGRAM: To be provided  GOALS: Goals reviewed with patient? Yes  SHORT TERM GOALS: = LTG based on PT POC length   LONG TERM GOALS: Target date: 02/23/24  Pt will be independent with final HEP for improved symptom report and balance  Baseline: to be provided Goal status: INITIAL  2.  MCTSIB goal Baseline: to be provided Goal status: INITIAL  3.  MSQ goal Baseline: to be provided Goal status: INITIAL  ASSESSMENT:  CLINICAL IMPRESSION: Patient is a 78 y.o. male who was seen today for physical therapy evaluation and treatment for dizziness. 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 He experienced sudden onset dizziness in August and went to the hospital where they completed an MRI. In that imaging, there was mention of a chronic R lacunar infarct impacting the pons. Since this event, the patient has been experiencing near constant, room-spinning dizziness regardless of position. His vestibular exam today was consistent with dizziness of central origin. He would benefit from skilled PT services to address the above mention deficits.    OBJECTIVE IMPAIRMENTS: Abnormal gait, decreased balance, decreased knowledge of condition, dizziness, impaired UE functional use, and impaired vision/preception.   ACTIVITY LIMITATIONS: locomotion level and caring for others  PARTICIPATION LIMITATIONS: interpersonal relationship, driving, shopping, and community activity  PERSONAL FACTORS: Age, Past/current experiences, and Time since onset of injury/illness/exacerbation are also affecting patient's functional outcome.   REHAB POTENTIAL: Fair time since onset, etiology   CLINICAL DECISION MAKING: Evolving/moderate complexity  EVALUATION COMPLEXITY: Moderate   PLAN:  PT FREQUENCY: 2x/week  PT DURATION: 4 weeks  PLANNED INTERVENTIONS: 02835- PT Re-evaluation, 97750- Physical Performance Testing, 97110-Therapeutic exercises, 97530- Therapeutic activity, [id number]- Neuromuscular re-education, 97535- Self Care, 02859- Manual therapy, [phone]- Gait training, [phone]- Orthotic Initial, [phone]- Orthotic/Prosthetic subsequent, [phone]- Canalith repositioning, [phone]- Aquatic Therapy, Patient/Family education, Balance training, Stair training, Vestibular training, Visual/preceptual remediation/compensation, and DME instructions  PLAN FOR NEXT SESSION: MSQ/ MCTSIB + Goal, HEP, re-check positional    Delon DELENA Pop, PT Delon DELENA Pop, PT, DPT, CBIS  01/25/2024, 1:41 PM

## 2024-02-06 ENCOUNTER — Encounter (INDEPENDENT_AMBULATORY_CARE_PROVIDER_SITE_OTHER): Payer: Self-pay | Admitting: Ophthalmology

## 2024-02-06 ENCOUNTER — Encounter: Payer: Self-pay | Admitting: Physical Therapy

## 2024-02-06 ENCOUNTER — Ambulatory Visit: Admitting: Physical Therapy

## 2024-02-06 ENCOUNTER — Ambulatory Visit (INDEPENDENT_AMBULATORY_CARE_PROVIDER_SITE_OTHER): Admitting: Ophthalmology

## 2024-02-06 VITALS — BP 128/80 | HR 97

## 2024-02-06 DIAGNOSIS — E119 Type 2 diabetes mellitus without complications: Secondary | ICD-10-CM

## 2024-02-06 DIAGNOSIS — I10 Essential (primary) hypertension: Secondary | ICD-10-CM

## 2024-02-06 DIAGNOSIS — R2681 Unsteadiness on feet: Secondary | ICD-10-CM | POA: Diagnosis not present

## 2024-02-06 DIAGNOSIS — H3581 Retinal edema: Secondary | ICD-10-CM

## 2024-02-06 DIAGNOSIS — Z7984 Long term (current) use of oral hypoglycemic drugs: Secondary | ICD-10-CM

## 2024-02-06 DIAGNOSIS — Z794 Long term (current) use of insulin: Secondary | ICD-10-CM | POA: Diagnosis not present

## 2024-02-06 DIAGNOSIS — H35033 Hypertensive retinopathy, bilateral: Secondary | ICD-10-CM | POA: Diagnosis not present

## 2024-02-06 DIAGNOSIS — H25813 Combined forms of age-related cataract, bilateral: Secondary | ICD-10-CM

## 2024-02-06 DIAGNOSIS — R42 Dizziness and giddiness: Secondary | ICD-10-CM

## 2024-02-06 NOTE — Therapy (Signed)
 OUTPATIENT PHYSICAL THERAPY VESTIBULAR TREATMENT     Patient Name: Kevin Flynn MRN: [id number] DOB:11/05/1945, 78 y.o., male Today's Date: 02/06/2024  END OF SESSION:  PT End of Session - 02/06/24 0934     Visit Number 2    Number of Visits 9    Date for Recertification  02/23/24    Authorization Type [employer]- medicare    PT Start Time 0932    PT Stop Time 1010    PT Time Calculation (min) 38 min    Equipment Utilized During Treatment Gait belt    Activity Tolerance Patient tolerated treatment well    Behavior During Therapy WFL for tasks assessed/performed          Past Medical History:  Diagnosis Date   Depression    Diabetes mellitus without complication (HCC)    Hypertension    Past Surgical History:  Procedure Laterality Date   KNEE SURGERY Left tendon repair   RIGHT/LEFT HEART CATH AND CORONARY ANGIOGRAPHY N/A 06/08/2023   Procedure: RIGHT/LEFT HEART CATH AND CORONARY ANGIOGRAPHY;  Surgeon: Jordan, Peter M, MD;  Location: [facility];  Service: Cardiovascular;  Laterality: N/A;   Patient Active Problem List   Diagnosis Date Noted   Cardiomyopathy (HCC) 04/21/2023   HFrEF (heart failure with reduced ejection fraction) (HCC) 04/21/2023   Diabetes mellitus (HCC) 04/21/2023   Former smoker 04/21/2023   Sleep apnea in adult 04/21/2023   Mixed hyperlipidemia 04/21/2023   Acute CVA (cerebrovascular accident) (HCC) 04/21/2023   Stroke (cerebrum) (HCC) 04/20/2023   Leukopenia 04/20/2023   Pleural effusion 04/20/2023    [facility]  REFERRING PROVIDER: Murray Collier, MD  REFERRING DIAG: R42 (ICD-10-CM) - Vertigo   THERAPY DIAG:  Unsteadiness on feet  Dizziness and giddiness  ONSET DATE: 01/18/24 referral   Rationale for Evaluation and Treatment: Rehabilitation  SUBJECTIVE:   SUBJECTIVE STATEMENT: Feels the spinning dizziness right now sitting. Reports he drove here today, and reports he did not have any dizziness until he parked the  car here at the clinic. Took all his medications before coming here. No falls. Reports he has an eye doctor appt later today.   Pt accompanied by: self   PERTINENT HISTORY: depression, DM, HTN, CVA jan 2025  PAIN:  Are you having pain? No  PRECAUTIONS: Fall  RED FLAGS: None   WEIGHT BEARING RESTRICTIONS: No  FALLS: Has patient fallen in last 6 months? No Denies near falls  LIVING ENVIRONMENT: Lives with: lives with their family Lives in: House/apartment Stairs: Yes: External: 3 steps; can reach both Has following equipment at home: Single point cane and Grab bars  PLOF: Independent short distance driving   PATIENT GOALS: to help with the dizziness   OBJECTIVE:  Note: Objective measures were completed at Evaluation unless otherwise noted.  DIAGNOSTIC FINDINGS:  11/15/23 brain MRI IMPRESSION: 1. No acute intracranial abnormality. 2. Moderate amount of age-related cerebral volume loss. 3. 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 4. Chronic lacunar infarct within the right pons.  04/20/23 CT angio IMPRESSION: 1. Evaluation is limited by bolus timing, despite multiple attempts to time the contrast. Given the slow is with which the contrast passed from the pulmonary arteries to the aorta, a cardiac etiology is suspected. 2. The intracranial and extracranial vertebral arteries are not sufficiently opacified for adequate evaluation. The PCAs are primarily supplied by the posterior communicating arteries. Opacification of the distal basilar artery may be retrograde. 3. Moderate stenosis  in the distal left M1. Mild stenosis right cavernous and supraclinoid ICA. 4. Severe stenosis in the mid right P2 and proximal left P2. 5. 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  04/20/23 brain MRI IMPRESSION: 1. Acute and subacute infarcts in the posterior left  frontal cortex, which includes the motor strip. 2. Loss of the left vertebral artery flow void in the V2, V3, and V4 segments, which is concerning for occlusion or slow flow. This could be further evaluated with a CTA of the head and neck. 3. C3-C4 moderate right neural foraminal narrowing. 4. C6-C7 mild left neural foraminal narrowing. 5. No spinal canal stenosis.  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  STRENGTH: WFL   BED MOBILITY:  Independent, denies changes/increases in dizziness   GAIT: Gait pattern: step through pattern, decreased arm swing- Right, decreased arm swing- Left, and wide BOS   VESTIBULAR ASSESSMENT:  GENERAL OBSERVATION: NAD, no AD   SYMPTOM BEHAVIOR:  Subjective history: see above  Non-Vestibular symptoms: changes in vision blurred  Type of dizziness: Blurred Vision and Spinning/Vertigo  Frequency: 90% of the day   Duration: 90% of the day (only not noticeable when sleeping)   Aggravating factors: Spontaneous and Induced by motion: turning body quickly and turning head quickly  Relieving factors: no known relieving factors  Progression of symptoms: unchanged  OCULOMOTOR EXAM:  Ocular Alignment: normal  Ocular ROM: No Limitations  Spontaneous Nystagmus: absent  Gaze-Induced Nystagmus: right beating with right gaze  Smooth Pursuits: saccades  Saccades: hypometric/undershoots to the R  Convergence/Divergence: ~10 cm   Cover-cross-cover test: Deviations: L eye cover/uncover: medial inferior correction    VESTIBULAR - OCULAR REFLEX:   Slow VOR: Normal  VOR Cancellation: Corrective Saccades  Head-Impulse Test: HIT Right: positive HIT Left: positive  Dynamic Visual Acuity: TBA   POSITIONAL TESTING: Right Roll Test: pure  downbeat  Left Roll Test: no nystagmus  MOTION SENSITIVITY:  Motion Sensitivity Quotient Intensity: 0 = none, 1 = Lightheaded, 2 = Mild, 3 = Moderate, 4 = Severe, 5 = Vomiting  Intensity  1. Sitting to supine 3  2. Supine to L side 3  3. Supine to R side 3  4. Supine to sitting 3  5. L Hallpike-Dix   6. Up from L    7. R Hallpike-Dix   8. Up from R    9. Sitting, head tipped to L knee   10. Head up from L knee   11. Sitting, head tipped to R knee   12. Head up from R knee   13. Sitting head turns x5   14.Sitting head nods x5   15. In stance, 180 turn to L    16. In stance, 180 turn to R                                                         TREATMENT  Self-Care: Vitals:   02/06/24 0940  BP: 128/80  Pulse: 97    PT discussed concerns with pt regarding driving and dizziness as pt reports that he can just have dizziness  that comes on suddenly. Pt reports that he does not have any dizziness when driving. Recommended pt get a ride from his spouse   Therapeutic Activity:   MOTION SENSITIVITY:  Bolded performed on 02/06/24   Motion Sensitivity Quotient Intensity: 0 = none, 1 = Lightheaded, 2 = Mild, 3 = Moderate, 4 = Severe, 5 = Vomiting  Intensity  1. Sitting to supine 3  2. Supine to L side 3  3. Supine to R side 3  4. Supine to sitting 3  5. L Hallpike-Dix 2  6. Up from L  0  7. R Hallpike-Dix 3  8. Up from R  2  9. Sitting, head tipped to L knee 0  10. Head up from L knee 0  11. Sitting, head tipped to R knee 3  12. Head up from R knee 3  13. Sitting head turns x5 2  14.Sitting head nods x5 2  15. In stance, 180 turn to L  2  16. In stance, 180 turn to R 2  *performed DixHallpike as Sidelying as pt with limited cervical extension   POSITIONAL TESTING: Right Sidelying: pure downbeating and pt reporting more dizziness on this side compared to L, slightly more pronounced nystagmus Left Sidelying: pure downbeating and very low amplitude     M-CTSIB   Condition 1: Firm Surface, EO 30 Sec, Normal Sway  Condition 2: Firm Surface, EC 30 Sec, Mild Sway  Condition 3: Foam Surface, EO 30 Sec, Normal Sway  Condition 4: Foam Surface, EC 25 Sec, Mild/Mod Sway    NMR: Access Code: [email or web address] URL: [email or web address] Date: 02/06/2024 Prepared by: Sheffield Senate  Initiated HEP for balance:   Exercises - Standing Balance with Eyes Closed on Foam  - 1 x daily - 5 x weekly - 3 sets - 30 hold - Standing with Head Rotation  - 1 x daily - 5 x weekly - 2 sets - 10 reps - standing on foam pad, performing head turns and nods, pt more unsteady with head turns    PATIENT EDUCATION: Education details: results from further testing, see self-care, initial HEP for balance  Person educated: Patient Education method: Explanation, Demonstration, Verbal cues, and Handouts Education comprehension: verbalized understanding, returned demonstration, and needs further education  HOME EXERCISE PROGRAM: Access Code: [email or web address] URL: [email or web address] Date: 02/06/2024 Prepared by: Sheffield Senate  Exercises - Standing Balance with Eyes Closed on Foam  - 1 x daily - 5 x weekly - 3 sets - 30 hold - Standing with Head Rotation  - 1 x daily - 5 x weekly - 2 sets - 10 reps  GOALS: Goals reviewed with patient? Yes  SHORT TERM GOALS: = LTG based on PT POC length   LONG TERM GOALS: Target date: 02/23/24  Pt will be independent with final HEP for improved symptom report and balance  Baseline: to be provided Goal status: INITIAL  2.  Pt will improve condition 4 of mCTSIB to at least 30 seconds with mild sway to demo improved vestibular input for balance.  Baseline:25 seconds, with min/mod sawy  Goal status: INITIAL  3.  Pt will report items on MSQ as a 1/5 or less in order to demo improved motion sensitivity.  Baseline: see chart on 02/06/24 Goal status: INITIAL  ASSESSMENT:  CLINICAL IMPRESSION: Today's skilled session  focused on further vestibular assessment with MSQ and mCTSIB. With testing in R and L sidelying (unable to do DixHallpike due to significant decr ROM), with pt having dizziness and downbeating  nystagmus (indicating a central origin of dizziness). Pt reporting majority of items on MSQ as 2-3/5. Pt able to hold conditions 1-3 of mCTSIB for 30 seconds and 25 seconds for condition 4 with incr postural sway, indicating decr vestibular input for balance. Remainder of session focused on initiating HEP for balance with EC and head movement, pt more unsteady with head turns. Will continue per POC.    OBJECTIVE IMPAIRMENTS: Abnormal gait, decreased balance, decreased knowledge of condition, dizziness, impaired UE functional use, and impaired vision/preception.   ACTIVITY LIMITATIONS: locomotion level and caring for others  PARTICIPATION LIMITATIONS: interpersonal relationship, driving, shopping, and community activity  PERSONAL FACTORS: Age, Past/current experiences, and Time since onset of injury/illness/exacerbation are also affecting patient's functional outcome.   REHAB POTENTIAL: Fair time since onset, etiology   CLINICAL DECISION MAKING: Evolving/moderate complexity  EVALUATION COMPLEXITY: Moderate   PLAN:  PT FREQUENCY: 2x/week  PT DURATION: 4 weeks  PLANNED INTERVENTIONS: 97164- PT Re-evaluation, 97750- Physical Performance Testing, 97110-Therapeutic exercises, 97530- Therapeutic activity, [id number]- Neuromuscular re-education, 97535- Self Care, [id number]- Manual therapy, [phone]- Gait training, [phone]- Orthotic Initial, [phone]- Orthotic/Prosthetic subsequent, [phone]- Canalith repositioning, [phone]- Aquatic Therapy, Patient/Family education, Balance training, Stair training, Vestibular training, Visual/preceptual remediation/compensation, and DME instructions  PLAN FOR NEXT SESSION: work on balance on unlevel surfaces, EC, work on tasks on [employer]  Try VOR cancellation or VOR tasks    Sheffield Senate, PT,  DPT 02/06/24 11:13 AM

## 2024-02-08 ENCOUNTER — Ambulatory Visit: Admitting: Physical Therapy

## 2024-02-09 ENCOUNTER — Ambulatory Visit: Admitting: Physical Therapy

## 2024-02-11 ENCOUNTER — Encounter (INDEPENDENT_AMBULATORY_CARE_PROVIDER_SITE_OTHER): Payer: Self-pay | Admitting: Ophthalmology

## 2024-02-15 ENCOUNTER — Ambulatory Visit: Admitting: Physical Therapy

## 2024-02-20 ENCOUNTER — Ambulatory Visit: Admitting: Physical Therapy

## 2024-02-22 ENCOUNTER — Ambulatory Visit

## 2024-02-27 ENCOUNTER — Ambulatory Visit: Admitting: Physical Therapy

## 2024-02-29 ENCOUNTER — Ambulatory Visit

## 2025-02-11 ENCOUNTER — Encounter (INDEPENDENT_AMBULATORY_CARE_PROVIDER_SITE_OTHER): Admitting: Ophthalmology
